# Patient Record
Sex: Male | Born: 1960 | Race: White | Hispanic: No | Marital: Single | Smoking: Current every day smoker
Health system: Southern US, Community
[De-identification: ages and names within clinical notes are randomized; demographics above are authoritative.]

## PROBLEM LIST (undated history)

## (undated) DIAGNOSIS — I639 Cerebral infarction, unspecified: Secondary | ICD-10-CM

## (undated) DIAGNOSIS — I2510 Atherosclerotic heart disease of native coronary artery without angina pectoris: Secondary | ICD-10-CM

## (undated) HISTORY — PX: CARDIAC SURGERY: SHX584

---

## 2005-08-31 ENCOUNTER — Emergency Department: Payer: Self-pay | Admitting: Unknown Physician Specialty

## 2008-10-07 ENCOUNTER — Inpatient Hospital Stay: Payer: Self-pay | Admitting: Specialist

## 2009-11-30 ENCOUNTER — Emergency Department: Payer: Self-pay | Admitting: Emergency Medicine

## 2011-02-22 ENCOUNTER — Emergency Department: Payer: Self-pay | Admitting: Emergency Medicine

## 2016-12-22 ENCOUNTER — Emergency Department
Admission: EM | Admit: 2016-12-22 | Discharge: 2016-12-22 | Disposition: A | Discharge status: Home / Self Care | Payer: Self-pay | Attending: Student in an Organized Health Care Education/Training Program | Admitting: Student in an Organized Health Care Education/Training Program

## 2016-12-22 ENCOUNTER — Emergency Department: Payer: Self-pay

## 2016-12-22 ENCOUNTER — Encounter: Payer: Self-pay | Admitting: Emergency Medicine

## 2016-12-22 DIAGNOSIS — Y9389 Activity, other specified: Secondary | ICD-10-CM | POA: Insufficient documentation

## 2016-12-22 DIAGNOSIS — F17200 Nicotine dependence, unspecified, uncomplicated: Secondary | ICD-10-CM | POA: Insufficient documentation

## 2016-12-22 DIAGNOSIS — I2510 Atherosclerotic heart disease of native coronary artery without angina pectoris: Secondary | ICD-10-CM | POA: Insufficient documentation

## 2016-12-22 DIAGNOSIS — S39012A Strain of muscle, fascia and tendon of lower back, initial encounter: Secondary | ICD-10-CM | POA: Insufficient documentation

## 2016-12-22 DIAGNOSIS — W010XXA Fall on same level from slipping, tripping and stumbling without subsequent striking against object, initial encounter: Secondary | ICD-10-CM | POA: Insufficient documentation

## 2016-12-22 DIAGNOSIS — Y999 Unspecified external cause status: Secondary | ICD-10-CM | POA: Insufficient documentation

## 2016-12-22 DIAGNOSIS — Y929 Unspecified place or not applicable: Secondary | ICD-10-CM | POA: Insufficient documentation

## 2016-12-22 HISTORY — DX: Cerebral infarction, unspecified: I63.9

## 2016-12-22 HISTORY — DX: Atherosclerotic heart disease of native coronary artery without angina pectoris: I25.10

## 2016-12-22 MED ORDER — METHOCARBAMOL 500 MG PO TABS: 500.0000 mg | ORAL_TABLET | Freq: Four times a day (QID) | ORAL | 0 refills | Status: DC

## 2016-12-22 MED ORDER — KETOROLAC TROMETHAMINE 30 MG/ML IJ SOLN
30.0000 mg | Freq: Once | INTRAMUSCULAR | Status: AC
  Administered 2016-12-22: 30 mg via INTRAMUSCULAR
  Filled 2016-12-22: qty 1

## 2016-12-22 MED ORDER — MELOXICAM 15 MG PO TABS: 15.0000 mg | ORAL_TABLET | Freq: Every day | ORAL | 0 refills | Status: DC

## 2016-12-22 MED ORDER — ORPHENADRINE CITRATE 30 MG/ML IJ SOLN
60.0000 mg | Freq: Once | INTRAMUSCULAR | Status: AC
  Administered 2016-12-22: 60 mg via INTRAMUSCULAR
  Filled 2016-12-22: qty 2

## 2016-12-22 NOTE — ED Provider Notes (Signed)
[facility] Emergency Department Provider Note  ____________________________________________  Time seen: Approximately 3:03 PM  I have reviewed the triage vital signs and the nursing notes.   HISTORY  Chief Complaint Back Pain    HPI Kevin Hopkins is a 56 y.o. male who presents to emergency department complaining of right-sided lower back pain. Patient states that approximately a week ago he had been drinking heavily when he slipped and fell on ice landing directly on his lower back. Patient states that he's had sharp, continual pain to the right lower back. He denies any radiation. No bowel or bladder dysfunction, saddle anesthesia, paresthesias. No history of back issues.  Patient states that due to his excessive drinking, [facility] a [facility] and is seeking help there for his drinking issues. Patient reports at this time he is not able to be prescribed any controlled substances.   Past Medical History:  Diagnosis Date  . Coronary artery disease   . CVA (cerebral vascular accident) (HCC)     There are no active problems to display for this patient.   Past Surgical History:  Procedure Laterality Date  . CARDIAC SURGERY      Prior to Admission medications   Medication Sig Start Date End Date Taking? Authorizing Provider  meloxicam (MOBIC) 15 MG tablet Take 1 tablet (15 mg total) by mouth daily. 12/22/16   Delorise Royals Stesha Neyens, PA-C  methocarbamol (ROBAXIN) 500 MG tablet Take 1 tablet (500 mg total) by mouth 4 (four) times daily. 12/22/16   Delorise Royals Thorne Wirz, PA-C    Allergies Patient has no allergy information on record.  History reviewed. No pertinent family history.  Social History Social History  Substance Use Topics  . Smoking status: Current Every Day Smoker    Packs/day: 1.00  . Smokeless tobacco: Never Used  . Alcohol use No     Review of Systems  Constitutional: No fever/chills Eyes: No visual  changes. Cardiovascular: no chest pain. Respiratory: no cough. No SOB. Gastrointestinal: No abdominal pain.  No nausea, no vomiting.   Genitourinary: Negative for dysuria. No hematuria Musculoskeletal: Positive for right lower back pain Skin: Negative for rash, abrasions, lacerations, ecchymosis. Neurological: Negative for headaches, focal weakness or numbness. 10-point ROS otherwise negative.  ____________________________________________   PHYSICAL EXAM:  VITAL SIGNS: ED Triage Vitals  Enc Vitals Group     BP 12/22/16 1423 129/81     Pulse Rate 12/22/16 1423 81     Resp 12/22/16 1423 16     Temp 12/22/16 1423 97.9 F (36.6 C)     Temp Source 12/22/16 1423 Oral     SpO2 12/22/16 1423 98 %     Weight 12/22/16 1424 160 lb (72.6 kg)     Height 12/22/16 1424 5\' 6"  (1.676 m)     Head Circumference --      Peak Flow --      Pain Score 12/22/16 1432 8     Pain Loc --      Pain Edu? --      Excl. in GC? --      Constitutional: Alert and oriented. Well appearing and in no acute distress. Eyes: Conjunctivae are normal. PERRL. EOMI. Head: Atraumatic. Neck: No stridor.    Cardiovascular: Normal rate, regular rhythm. Normal S1 and S2.  Good peripheral circulation. Respiratory: Normal respiratory effort without tachypnea or retractions. Lungs CTAB. Good air entry to the bases with no decreased or absent breath sounds. Gastrointestinal: Bowel sounds 4 quadrants. Soft  and nontender to palpation. No guarding or rigidity. No palpable masses. No distention. No CVA tenderness. Musculoskeletal: Full range of motion to all extremities. No gross deformities appreciated. His oropharynx is on inspection. Full range of motion spine. No ecchymosis. Patient is very tender to palpation right side of the spine/paraspinal muscle group. Tender to palpation over the L3-L5 region. No palpable abnormality. No tenderness to palpation over sciatic notches bilaterally. Dorsalis pedis pulse intact bilateral  lower extremity. Sensation intact and equal in all dermatomal distributions in the lower extremity.    Neurologic:  Normal speech and language. No gross focal neurologic deficits are appreciated.  Skin:  Skin is warm, dry and intact. No rash noted. Psychiatric: Mood and affect are normal. Speech and behavior are normal. Patient exhibits appropriate insight and judgement.   ____________________________________________   LABS (all labs ordered are listed, but only abnormal results are displayed)  Labs Reviewed - No data to display ____________________________________________  EKG   ____________________________________________  RADIOLOGY Festus Barren Elveria Lauderbaugh, personally viewed and evaluated these images (plain radiographs) as part of my medical decision making, as well as reviewing the written report by the radiologist.  Dg Lumbar Spine Complete  Result Date: 12/22/2016 CLINICAL DATA:  Lower right back pain for 1 week. Fall 7-8 days ago. EXAM: LUMBAR SPINE - COMPLETE 4+ VIEW COMPARISON:  None. FINDINGS: Mild degenerative disc disease changes throughout the lumbar spine. Degenerative facet disease in the lower lumbar spine. Slight retrolisthesis of L5 on S1. No fracture. SI joints are symmetric and unremarkable. IMPRESSION: Degenerative changes.  No acute findings. Electronically Signed   By: Charlett Nose M.D.   On: 12/22/2016 15:53    ____________________________________________    PROCEDURES  Procedure(s) performed:    Procedures    Medications  ketorolac (TORADOL) 30 MG/ML injection 30 mg (30 mg Intramuscular Given 12/22/16 1610)  orphenadrine (NORFLEX) injection 60 mg (60 mg Intramuscular Given 12/22/16 1610)     ____________________________________________   INITIAL IMPRESSION / ASSESSMENT AND PLAN / ED COURSE  Pertinent labs & imaging results that were available during my care of the patient were reviewed by me and considered in my medical decision making (see  chart for details).  Review of the  [place] was performed in accordance of the [place] prior to dispensing any controlled drugs.     Patient's diagnosis is consistent with lumbosacral strain. Patient reports falling a week ago. X-ray reveals no acute osseous abnormality. Exam was reassuring with no neuro deficits. Patient was given injection of Toradol and Norflex in the emergency department. Patient will be discharged home with prescriptions for anti-inflammatories and muscle relaxer. Patient is to follow up with orthopedics as needed or otherwise directed. Patient is given ED precautions to return to the ED for any worsening or new symptoms.     ____________________________________________  FINAL CLINICAL IMPRESSION(S) / ED DIAGNOSES  Final diagnoses:  Strain of lumbar region, initial encounter      NEW MEDICATIONS STARTED DURING THIS VISIT:  New Prescriptions   MELOXICAM (MOBIC) 15 MG TABLET    Take 1 tablet (15 mg total) by mouth daily.   METHOCARBAMOL (ROBAXIN) 500 MG TABLET    Take 1 tablet (500 mg total) by mouth 4 (four) times daily.        This chart was dictated using voice recognition software/Dragon. Despite best efforts to proofread, errors can occur which can change the meaning. Any change was purely unintentional.    Racheal Patches, PA-C 12/22/16 1617    Luisa Hart  Roxan Hockeyobinson, MD 12/22/16 [phone]

## 2016-12-22 NOTE — ED Notes (Signed)
Pt given crackers and drink. Called for ride home.

## 2016-12-22 NOTE — ED Triage Notes (Signed)
Pt presents with lower back pain on right side after falling a week ago. He reports that he is staying at a mission and is not allowed to have any narcotics. Pt alert & oriented with NAD noted.

## 2017-01-16 ENCOUNTER — Ambulatory Visit: Payer: Self-pay | Admitting: Adult Health Nurse Practitioner

## 2017-01-16 VITALS — BP 131/85 | HR 92 | Temp 99.2°F | Wt 176.0 lb

## 2017-01-16 DIAGNOSIS — M25561 Pain in right knee: Principal | ICD-10-CM

## 2017-01-16 DIAGNOSIS — M25562 Pain in left knee: Principal | ICD-10-CM

## 2017-01-16 DIAGNOSIS — G8929 Other chronic pain: Secondary | ICD-10-CM

## 2017-01-16 DIAGNOSIS — M79674 Pain in right toe(s): Secondary | ICD-10-CM

## 2017-01-16 DIAGNOSIS — Z0000 Encounter for general adult medical examination without abnormal findings: Secondary | ICD-10-CM

## 2017-01-16 MED ORDER — GABAPENTIN 600 MG PO TABS: 600.0000 mg | ORAL_TABLET | Freq: Two times a day (BID) | ORAL | 3 refills | Status: DC

## 2017-01-16 NOTE — Addendum Note (Signed)
Addended by: Charlene BrookeLES-JOHNSON, Jamaiya Tunnell N on: 01/16/2017 07:42 PM   Modules accepted: Orders

## 2017-01-16 NOTE — Progress Notes (Signed)
  Patient: Kevin SitesRoger Rzasa Male    DOB: 1961/05/30   56 y.o.   MRN: [id number] Visit Date: 01/16/2017  Today's Provider: Jacelyn Pieah Doles-Johnson, NP   Chief Complaint  Patient presents with  . Foot Pain    big toe, about a year   . Knee Pain    recieves cortisone injections   Subjective:    HPI  Pt states that his big toe has been hurting for 2 years.  Denies injury.  States that the toenail looks weird but no redness or edema.   Taking gabapentin for bilateral knee and leg pain with moderate relief.   He states that sometimes he takes 1-2 tablets during the day for pain.  Pt states that he used to get knee injections.     Not on File Previous Medications   GABAPENTIN (NEURONTIN) 600 MG TABLET    Take 600 mg by mouth 2 (two) times daily. 1 pill in morning 1 pill at night   MELOXICAM (MOBIC) 15 MG TABLET    Take 1 tablet (15 mg total) by mouth daily.   METHOCARBAMOL (ROBAXIN) 500 MG TABLET    Take 1 tablet (500 mg total) by mouth 4 (four) times daily.    Review of Systems  All other systems reviewed and are negative.   Social History  Substance Use Topics  . Smoking status: Current Every Day Smoker    Packs/day: 1.00  . Smokeless tobacco: Never Used  . Alcohol use No   Objective:   BP 131/85   Pulse 92   Temp 99.2 F (37.3 C) (Oral)   Wt 176 lb (79.8 kg)   BMI 28.41 kg/m   Physical Exam  Constitutional: He is oriented to person, place, and time. He appears well-developed and well-nourished.  HENT:  Head: Normocephalic and atraumatic.  Eyes: Pupils are equal, round, and reactive to light.  Neck: Normal range of motion. Neck supple.  Cardiovascular: Normal rate, regular rhythm, normal heart sounds and intact distal pulses.   Pulmonary/Chest: Effort normal and breath sounds normal.  Abdominal: Soft. Bowel sounds are normal.  Musculoskeletal:       Feet:  Neurological: He is alert and oriented to person, place, and time.  Skin: Skin is warm and dry.  Psychiatric: He  has a normal mood and affect. His behavior is normal.  Vitals reviewed.       Assessment & Plan:         R great toe pain:  Will check uric acid.  Soak foot in Epsom salts- ? Ingrown toenail   Bilateral knee pain:  Refill gabapentin.  Referral ortho clinic.   Baseline labs.   Jacelyn Pieah Doles-Johnson, NP   [facility]

## 2017-01-17 ENCOUNTER — Other Ambulatory Visit: Payer: Self-pay

## 2017-01-17 DIAGNOSIS — M25561 Pain in right knee: Principal | ICD-10-CM

## 2017-01-17 DIAGNOSIS — G8929 Other chronic pain: Secondary | ICD-10-CM

## 2017-01-17 DIAGNOSIS — M25562 Pain in left knee: Principal | ICD-10-CM

## 2017-01-17 LAB — URINALYSIS
Bilirubin, UA: NEGATIVE
Glucose, UA: NEGATIVE
Ketones, UA: NEGATIVE
LEUKOCYTES UA: NEGATIVE
NITRITE UA: NEGATIVE
PH UA: 5.5 (ref 5.0–7.5)
Protein, UA: NEGATIVE
RBC, UA: NEGATIVE
Specific Gravity, UA: 1.021 (ref 1.005–1.030)
UUROB: 0.2 mg/dL (ref 0.2–1.0)

## 2017-01-17 LAB — CBC
HEMATOCRIT: 39.9 % (ref 37.5–51.0)
Hemoglobin: 14 g/dL (ref 13.0–17.7)
MCH: 32.2 pg (ref 26.6–33.0)
MCHC: 35.1 g/dL (ref 31.5–35.7)
MCV: 92 fL (ref 79–97)
Platelets: 293 10*3/uL (ref 150–379)
RBC: 4.35 x10E6/uL (ref 4.14–5.80)
RDW: 12.7 % (ref 12.3–15.4)
WBC: 9.1 10*3/uL (ref 3.4–10.8)

## 2017-01-17 LAB — COMPREHENSIVE METABOLIC PANEL
A/G RATIO: 1.7 (ref 1.2–2.2)
ALK PHOS: 58 IU/L (ref 39–117)
ALT: 15 IU/L (ref 0–44)
AST: 17 IU/L (ref 0–40)
Albumin: 4.3 g/dL (ref 3.5–5.5)
BILIRUBIN TOTAL: 0.3 mg/dL (ref 0.0–1.2)
BUN / CREAT RATIO: 13 (ref 9–20)
BUN: 17 mg/dL (ref 6–24)
CHLORIDE: 103 mmol/L (ref 96–106)
CO2: 23 mmol/L (ref 18–29)
Calcium: 9.5 mg/dL (ref 8.7–10.2)
Creatinine, Ser: 1.3 mg/dL — ABNORMAL HIGH (ref 0.76–1.27)
GFR calc Af Amer: 72 (ref >59)
GFR calc non Af Amer: 62 (ref >59)
GLOBULIN, TOTAL: 2.5 (ref 1.5–4.5)
Glucose: 88 mg/dL (ref 65–99)
POTASSIUM: 4.8 mmol/L (ref 3.5–5.2)
SODIUM: 143 mmol/L (ref 134–144)
Total Protein: 6.8 g/dL (ref 6.0–8.5)

## 2017-01-17 LAB — HEMOGLOBIN A1C
Est. average glucose Bld gHb Est-mCnc: 103
HEMOGLOBIN A1C: 5.2 % (ref 4.8–5.6)

## 2017-01-17 LAB — LIPID PANEL W/O CHOL/HDL RATIO
Cholesterol, Total: 142 mg/dL (ref 100–199)
HDL: 53 mg/dL (ref >39)
LDL Calculated: 72 (ref 0–99)
TRIGLYCERIDES: 83 mg/dL (ref 0–149)
VLDL CHOLESTEROL CAL: 17 (ref 5–40)

## 2017-01-17 LAB — PSA: Prostate Specific Ag, Serum: 0.7 ng/mL (ref 0.0–4.0)

## 2017-01-17 LAB — TSH: TSH: 1.52 u[IU]/mL (ref 0.450–4.500)

## 2017-01-17 LAB — URIC ACID: Uric Acid: 5.6 mg/dL (ref 3.7–8.6)

## 2017-01-17 MED ORDER — GABAPENTIN 600 MG PO TABS: 600.0000 mg | ORAL_TABLET | Freq: Two times a day (BID) | ORAL | 3 refills | Status: DC

## 2017-01-23 ENCOUNTER — Other Ambulatory Visit: Payer: Self-pay

## 2017-01-23 DIAGNOSIS — G8929 Other chronic pain: Secondary | ICD-10-CM

## 2017-01-23 DIAGNOSIS — M25562 Pain in left knee: Principal | ICD-10-CM

## 2017-01-23 DIAGNOSIS — M25561 Pain in right knee: Principal | ICD-10-CM

## 2017-01-23 MED ORDER — MELOXICAM 15 MG PO TABS: 15.0000 mg | ORAL_TABLET | Freq: Every day | ORAL | 1 refills | Status: DC

## 2017-01-23 MED ORDER — METHOCARBAMOL 500 MG PO TABS: 500.0000 mg | ORAL_TABLET | Freq: Four times a day (QID) | ORAL | 1 refills | Status: DC

## 2017-01-29 ENCOUNTER — Ambulatory Visit: Payer: Self-pay | Admitting: Specialist

## 2017-02-06 ENCOUNTER — Ambulatory Visit: Payer: Self-pay | Admitting: Ophthalmology

## 2017-02-27 ENCOUNTER — Telehealth: Payer: Self-pay

## 2017-02-27 ENCOUNTER — Telehealth: Payer: Self-pay | Admitting: Pharmacy Technician

## 2017-02-27 NOTE — Telephone Encounter (Signed)
Spoke with [name].  Patient no longer a resident at their facility.  [facility] unable to provide additional medication assistance until patient provides updated financial information.  Sherilyn Dacosta Care Manager [facility]

## 2017-02-27 NOTE — Telephone Encounter (Signed)
Spoke with [name]. Patient no longer residing at their facility.

## 2017-06-04 ENCOUNTER — Telehealth: Payer: Self-pay | Admitting: Pharmacy Technician

## 2017-06-04 NOTE — Telephone Encounter (Signed)
Patient eligible to receive medication assistance at [facility] through 2018, as long as eligibility requirements continue to be met.  Xoie Kreuser J. Lula Kolton Care Manager [facility] 

## 2017-09-24 ENCOUNTER — Emergency Department: Payer: Self-pay

## 2017-09-24 ENCOUNTER — Emergency Department
Admission: EM | Admit: 2017-09-24 | Discharge: 2017-09-24 | Disposition: A | Discharge status: Home / Self Care | Payer: Self-pay | Attending: Emergency Medicine | Admitting: Emergency Medicine

## 2017-09-24 DIAGNOSIS — K4090 Unilateral inguinal hernia, without obstruction or gangrene, not specified as recurrent: Secondary | ICD-10-CM | POA: Insufficient documentation

## 2017-09-24 DIAGNOSIS — I2510 Atherosclerotic heart disease of native coronary artery without angina pectoris: Secondary | ICD-10-CM | POA: Insufficient documentation

## 2017-09-24 DIAGNOSIS — R3915 Urgency of urination: Secondary | ICD-10-CM | POA: Insufficient documentation

## 2017-09-24 DIAGNOSIS — F17200 Nicotine dependence, unspecified, uncomplicated: Secondary | ICD-10-CM | POA: Insufficient documentation

## 2017-09-24 DIAGNOSIS — Z79899 Other long term (current) drug therapy: Secondary | ICD-10-CM | POA: Insufficient documentation

## 2017-09-24 DIAGNOSIS — Z8673 Personal history of transient ischemic attack (TIA), and cerebral infarction without residual deficits: Secondary | ICD-10-CM | POA: Insufficient documentation

## 2017-09-24 LAB — URINALYSIS, COMPLETE (UACMP) WITH MICROSCOPIC
BILIRUBIN URINE: NEGATIVE
Bacteria, UA: NONE SEEN
Glucose, UA: NEGATIVE mg/dL
Hgb urine dipstick: NEGATIVE
KETONES UR: 20 mg/dL — AB
Leukocytes, UA: NEGATIVE
Nitrite: NEGATIVE
Protein, ur: 30 mg/dL — AB
SPECIFIC GRAVITY, URINE: 1.021 (ref 1.005–1.030)
pH: 5 (ref 5.0–8.0)

## 2017-09-24 LAB — CBC
HCT: 48.7 % (ref 40.0–52.0)
HEMOGLOBIN: 16.4 g/dL (ref 13.0–18.0)
MCH: 33.6 pg (ref 26.0–34.0)
MCHC: 33.6 g/dL (ref 32.0–36.0)
MCV: 99.9 fL (ref 80.0–100.0)
Platelets: 302 10*3/uL (ref 150–440)
RBC: 4.88 MIL/uL (ref 4.40–5.90)
RDW: 13.8 % (ref 11.5–14.5)
WBC: 13.3 10*3/uL — AB (ref 3.8–10.6)

## 2017-09-24 LAB — COMPREHENSIVE METABOLIC PANEL
ALBUMIN: 5.1 g/dL — AB (ref 3.5–5.0)
ALT: 18 U/L (ref 17–63)
AST: 31 U/L (ref 15–41)
Alkaline Phosphatase: 51 U/L (ref 38–126)
Anion gap: 10 (ref 5–15)
BILIRUBIN TOTAL: 0.9 mg/dL (ref 0.3–1.2)
BUN: 9 mg/dL (ref 6–20)
CO2: 22 mmol/L (ref 22–32)
CREATININE: 0.84 mg/dL (ref 0.61–1.24)
Calcium: 9.4 mg/dL (ref 8.9–10.3)
Chloride: 106 mmol/L (ref 101–111)
GFR calc Af Amer: >60 mL/min (ref >60)
GLUCOSE: 61 mg/dL — AB (ref 65–99)
POTASSIUM: 4 mmol/L (ref 3.5–5.1)
Sodium: 138 mmol/L (ref 135–145)
Total Protein: 8.5 g/dL — ABNORMAL HIGH (ref 6.5–8.1)

## 2017-09-24 LAB — GLUCOSE, CAPILLARY
Glucose-Capillary: 62 mg/dL — ABNORMAL LOW (ref 65–99)
Glucose-Capillary: 74 mg/dL (ref 65–99)

## 2017-09-24 LAB — CHLAMYDIA/NGC RT PCR (ARMC ONLY)
CHLAMYDIA TR: NOT DETECTED
N GONORRHOEAE: NOT DETECTED

## 2017-09-24 MED ORDER — IOPAMIDOL (ISOVUE-300) INJECTION 61%
100.0000 mL | Freq: Once | INTRAVENOUS | Status: AC | PRN
  Administered 2017-09-24: 100 mL via INTRAVENOUS

## 2017-09-24 MED ORDER — TAMSULOSIN HCL 0.4 MG PO CAPS
0.4000 mg | ORAL_CAPSULE | Freq: Once | ORAL | Status: AC
  Administered 2017-09-24: 0.4 mg via ORAL
  Filled 2017-09-24: qty 1

## 2017-09-24 MED ORDER — TAMSULOSIN HCL 0.4 MG PO CAPS: 0.4000 mg | ORAL_CAPSULE | Freq: Every day | ORAL | 0 refills | Status: DC

## 2017-09-24 NOTE — ED Notes (Signed)
Pt drank oj, has not eaten any food yet

## 2017-09-24 NOTE — ED Provider Notes (Addendum)
[facility] Emergency Department Provider Note  ____________________________________________  Time seen: Approximately 7:45 AM  I have reviewed the triage vital signs and the nursing notes.   HISTORY  Chief Complaint Abdominal Pain   HPI Kevin Hopkins is a 56 y.o. male with h/o CVA and CAD who presents for evaluation of bilateral groin pain. Patient reports few days of constant sharp non radiating bilateral groin pain worse with standing. Also reports urgency and has to strain to be able to void. NO dysuria, no abdominal pain, no fever or chills, no nausea or vomiting, no constipation or diarrhea. Patient is homeless. Reports that he drinks alcohol every day. Hasn't eaten in several days. No history of STD. No unintentional weight loss. No night sweats.  Past Medical History:  Diagnosis Date  . Coronary artery disease   . CVA (cerebral vascular accident) [facility])     Patient Active Problem List   Diagnosis Date Noted  . Great toe pain, right 01/16/2017  . Chronic pain of both knees 01/16/2017    Past Surgical History:  Procedure Laterality Date  . CARDIAC SURGERY      Prior to Admission medications   Medication Sig Start Date End Date Taking? Authorizing Provider  gabapentin (NEURONTIN) 600 MG tablet Take 1 tablet (600 mg total) by mouth 2 (two) times daily. 2  pills in morning 1 pill at night 01/17/17   Virl Axe, MD  meloxicam (MOBIC) 15 MG tablet Take 1 tablet (15 mg total) by mouth daily. 01/23/17   Michiel Cowboy A, PA-C  methocarbamol (ROBAXIN) 500 MG tablet Take 1 tablet (500 mg total) by mouth 4 (four) times daily. 01/23/17   Michiel Cowboy A, PA-C  tamsulosin (FLOMAX) 0.4 MG CAPS capsule Take 1 capsule (0.4 mg total) by mouth daily. 09/24/17   Nita Sickle, MD    Allergies Patient has no known allergies.  No family history on file.  Social History Social History  Substance Use Topics  . Smoking status: Current Every Day  Smoker    Packs/day: 1.00  . Smokeless tobacco: Never Used  . Alcohol use No    Review of Systems  Constitutional: Negative for fever. Eyes: Negative for visual changes. ENT: Negative for sore throat. Neck: No neck pain  Cardiovascular: Negative for chest pain. Respiratory: Negative for shortness of breath. Gastrointestinal: Negative for abdominal pain, vomiting or diarrhea. Genitourinary: Negative for dysuria. + urgency and bilateral groin pain Musculoskeletal: Negative for back pain. Skin: Negative for rash. Neurological: Negative for headaches, weakness or numbness. Psych: No SI or HI  ____________________________________________   PHYSICAL EXAM:  VITAL SIGNS: ED Triage Vitals  Enc Vitals Group     BP 09/24/17 0449 (!) 156/96     Pulse Rate 09/24/17 0449 98     Resp 09/24/17 0449 18     Temp 09/24/17 0449 97.9 F (36.6 C)     Temp Source 09/24/17 0449 Oral     SpO2 09/24/17 0449 99 %     Weight 09/24/17 0449 175 lb (79.4 kg)     Height 09/24/17 0449 5\' 6"  (1.676 m)     Head Circumference --      Peak Flow --      Pain Score 09/24/17 0448 0     Pain Loc --      Pain Edu? --      Excl. in GC? --     Constitutional: Alert and oriented, disheveled, no distress.  HEENT:  Head: Normocephalic and atraumatic.         Eyes: Conjunctivae are normal. Sclera is non-icteric.       Mouth/Throat: Mucous membranes are moist.       Neck: Supple with no signs of meningismus. Cardiovascular: Regular rate and rhythm. No murmurs, gallops, or rubs. 2+ symmetrical distal pulses are present in all extremities. No JVD. Respiratory: Normal respiratory effort. Lungs are clear to auscultation bilaterally. No wheezes, crackles, or rhonchi.  Gastrointestinal: Soft, non tender, and non distended with positive bowel sounds. No rebound or guarding. Genitourinary: No CVA tenderness. Bilateral testicles are descended with no tenderness to palpation, bilateral positive cremasteric reflexes  are present, no swelling or erythema of the scrotum. No evidence of inguinal hernia both laying and standing. Musculoskeletal: Nontender with normal range of motion in all extremities. No edema, cyanosis, or erythema of extremities. Neurologic: Normal speech and language. Face is symmetric. Moving all extremities. No gross focal neurologic deficits are appreciated. Skin: Skin is warm, dry and intact. No rash noted. Psychiatric: Mood and affect are normal. Speech and behavior are normal.  ____________________________________________   LABS (all labs ordered are listed, but only abnormal results are displayed)  Labs Reviewed  CBC - Abnormal; Notable for the following:       Result Value   WBC 13.3 (*)    All other components within normal limits  COMPREHENSIVE METABOLIC PANEL - Abnormal; Notable for the following:    Glucose, Bld 61 (*)    Total Protein 8.5 (*)    Albumin 5.1 (*)    All other components within normal limits  URINALYSIS, COMPLETE (UACMP) WITH MICROSCOPIC - Abnormal; Notable for the following:    Color, Urine YELLOW (*)    APPearance HAZY (*)    Ketones, ur 20 (*)    Protein, ur 30 (*)    Squamous Epithelial / LPF 0-5 (*)    All other components within normal limits  GLUCOSE, CAPILLARY - Abnormal; Notable for the following:    Glucose-Capillary 62 (*)    All other components within normal limits  CHLAMYDIA/NGC RT PCR ([facility] ONLY)  GLUCOSE, CAPILLARY  CBG MONITORING, ED   ____________________________________________  EKG  none ____________________________________________  RADIOLOGY  CT a/p: 1. No renal or ureteral calculi. No hydronephrosis. There are prostatic calculi. Urinary bladder wall does not appear appreciably thickened.  2. Left-sided colonic diverticula without diverticulitis. No bowel obstruction. No abscess. Appendix appears normal.  3. Fat containing left inguinal hernia. Minimal ventral hernia containing only fat. Small hiatal  hernia.  4. Aortoiliac atherosclerosis as well as foci of coronary artery calcification.  5. Hepatic steatosis.  6. Old rib fractures on the right. Mild anterior wedging of T11 vertebral body which appears old. No blastic or lytic bone lesion evident. ____________________________________________   PROCEDURES  Procedure(s) performed: None Procedures Critical Care performed:  None ____________________________________________   INITIAL IMPRESSION / ASSESSMENT AND PLAN / ED COURSE   56 y.o. male with h/o CVA and CAD who presents for evaluation of bilateral groin pain and urinary urgency for a few days. Patient disposition: But in no distress, his vitals are within normal limits, abdomen is soft with no tenderness throughout, GU exam is within normal limits with no evidence of torsion, no tenderness, no swelling, no erythema, no penile discharge, no evidence of hernia. Will get bladder scan to rule out urinary retention. Will check UA to rule out UTI. Will check GC and chlamydia.     _________________________ 2:29 PM on 09/24/2017 -----------------------------------------  labs show a leukocytosis with white count of 13 which prompted a CT abdomen and pelvis.CT showing L sided small inguinal hernia containing fat only which is not palpable on exam. No other acute findings. Post void scan of the bladder showing 130 cc probably from BPH. Patient has been urinating in the emergency department with no evidence of retention. We'll start him on Flomax. STD negative. There is no evidence of UTI or acute kidney injury. UA with no evidence of urinary tract infection. Patient remains well appearing. We'll discharge home at this time.   As part of my medical decision making, I reviewed the following data within the electronic MEDICAL RECORD NUMBER Nursing notes reviewed and incorporated, Labs reviewed , Radiograph reviewed , Notes from prior ED visits and [place] Controlled Substance Database    Pertinent  labs & imaging results that were available during my care of the patient were reviewed by me and considered in my medical decision making (see chart for details).    ____________________________________________   FINAL CLINICAL IMPRESSION(S) / ED DIAGNOSES  Final diagnoses:  Urinary urgency  Left inguinal hernia      NEW MEDICATIONS STARTED DURING THIS VISIT:  New Prescriptions   TAMSULOSIN (FLOMAX) 0.4 MG CAPS CAPSULE    Take 1 capsule (0.4 mg total) by mouth daily.     Note:  This document was prepared using Dragon voice recognition software and may include unintentional dictation errors.    Nita Sickle, MD 09/24/17 1431    Nita Sickle, MD 09/24/17 [phone]

## 2017-09-24 NOTE — ED Notes (Signed)
cbg 62

## 2017-09-24 NOTE — ED Notes (Signed)
Pt presents with groin pain x 2 days with urinary urgency and "feels like I can't go." Pt denies n/v/d; CBG is low. Pt states he has not eaten "in a couple of days." Pt alert & oriented, slow to answer questions. NAD noted.

## 2017-09-24 NOTE — ED Notes (Signed)
Pt discharged home after verbalizing understanding of discharge instructions; nad noted. 

## 2017-09-24 NOTE — ED Triage Notes (Signed)
Pt in with co groin pain for few days more when he walks. Denies any injury, but does have some urinary urgency.

## 2017-09-24 NOTE — ED Notes (Signed)
Bladder [place]\an shows 131 ml in bladder

## 2017-09-24 NOTE — ED Notes (Signed)
Pt given OJ, graham crackers, banana

## 2017-09-24 NOTE — ED Notes (Signed)
Pt is homeless and states he is not hungry; agrees to eat crackers and drink juice to get his sugar up.

## 2017-12-16 ENCOUNTER — Ambulatory Visit: Payer: Self-pay | Admitting: Adult Health Nurse Practitioner

## 2017-12-16 VITALS — BP 144/101 | HR 106 | Temp 97.5°F | Wt 178.8 lb

## 2017-12-16 DIAGNOSIS — D72829 Elevated white blood cell count, unspecified: Secondary | ICD-10-CM

## 2017-12-16 DIAGNOSIS — R030 Elevated blood-pressure reading, without diagnosis of hypertension: Secondary | ICD-10-CM

## 2017-12-16 NOTE — Progress Notes (Signed)
  Patient: Kevin SitesRoger Vea Male    DOB: 04-Jul-1961   57 y.o.   MRN: [id number] Visit Date: 12/16/2017  Today's Provider: [facility]   Chief Complaint  Patient presents with  . Follow-up    pt wants a physical for disability   Subjective:    HPI   He wants a physical for disability.  Pt states that [facility].  Pt states that he had a 24oz beer today.  Pt had WBC of 13.3 on labs about 2 months ago.  Afebrile- states that he felt like "crap" 2 weeks ago and took tylenol and now he feels better.      No Known Allergies Previous Medications   GABAPENTIN (NEURONTIN) 600 MG TABLET    Take 1 tablet (600 mg total) by mouth 2 (two) times daily. 2  pills in morning 1 pill at night   MELOXICAM (MOBIC) 15 MG TABLET    Take 1 tablet (15 mg total) by mouth daily.   METHOCARBAMOL (ROBAXIN) 500 MG TABLET    Take 1 tablet (500 mg total) by mouth 4 (four) times daily.   TAMSULOSIN (FLOMAX) 0.4 MG CAPS CAPSULE    Take 1 capsule (0.4 mg total) by mouth daily.    Review of Systems  All other systems reviewed and are negative.   Social History   Tobacco Use  . Smoking status: Current Every Day Smoker    Packs/day: 1.00  . Smokeless tobacco: Never Used  Substance Use Topics  . Alcohol use: No   Objective:   BP (!) 144/101   Pulse (!) 106   Temp (!) 97.5 F (36.4 C) (Oral)   Wt 178 lb 12.8 oz (81.1 kg)   BMI 28.86 kg/m   Physical Exam  Constitutional: He appears well-developed and well-nourished.  HENT:  Head: Normocephalic and atraumatic.  Cardiovascular: Normal rate and regular rhythm.  Pulmonary/Chest: Effort normal and breath sounds normal.  Abdominal: Soft. Bowel sounds are normal.  Vitals reviewed.       Assessment & Plan:        Repeat CBC with diff.  FU in 4 weeks for BP check when sober.    [facility]   [facility]

## 2017-12-17 LAB — CBC WITH DIFFERENTIAL/PLATELET
BASOS ABS: 0 10*3/uL (ref 0.0–0.2)
Basos: 1 %
EOS (ABSOLUTE): 0.3 10*3/uL (ref 0.0–0.4)
Eos: 4 %
Hematocrit: 42.5 % (ref 37.5–51.0)
Hemoglobin: 15.2 g/dL (ref 13.0–17.7)
Immature Grans (Abs): 0 10*3/uL (ref 0.0–0.1)
Immature Granulocytes: 0 %
LYMPHS ABS: 3 10*3/uL (ref 0.7–3.1)
Lymphs: 38 %
MCH: 33 pg (ref 26.6–33.0)
MCHC: 35.8 g/dL — AB (ref 31.5–35.7)
MCV: 92 fL (ref 79–97)
MONOCYTES: 9 %
Monocytes Absolute: 0.8 10*3/uL (ref 0.1–0.9)
NEUTROS ABS: 3.9 10*3/uL (ref 1.4–7.0)
Neutrophils: 48 %
PLATELETS: 372 10*3/uL (ref 150–379)
RBC: 4.6 x10E6/uL (ref 4.14–5.80)
RDW: 13.4 % (ref 12.3–15.4)
WBC: 8 10*3/uL (ref 3.4–10.8)

## 2018-01-20 ENCOUNTER — Ambulatory Visit: Payer: Self-pay | Admitting: Family Medicine

## 2018-01-20 VITALS — BP 131/79 | HR 91 | Temp 98.3°F | Wt 177.9 lb

## 2018-01-20 DIAGNOSIS — M25561 Pain in right knee: Secondary | ICD-10-CM

## 2018-01-20 DIAGNOSIS — M25562 Pain in left knee: Secondary | ICD-10-CM

## 2018-01-20 DIAGNOSIS — Z09 Encounter for follow-up examination after completed treatment for conditions other than malignant neoplasm: Secondary | ICD-10-CM

## 2018-01-20 DIAGNOSIS — G8929 Other chronic pain: Secondary | ICD-10-CM

## 2018-01-20 DIAGNOSIS — M79674 Pain in right toe(s): Secondary | ICD-10-CM

## 2018-01-20 MED ORDER — GABAPENTIN 600 MG PO TABS: 600.0000 mg | ORAL_TABLET | Freq: Two times a day (BID) | ORAL | 3 refills | Status: AC

## 2018-01-20 NOTE — Progress Notes (Signed)
  Patient: Kevin Hopkins Male    DOB: 12-28-1960   57 y.o.   MRN: [id number] Visit Date: 01/20/2018  Today's Provider: Kallie LocksNatalie M Mellie Buccellato, FNP   Chief Complaint  Patient presents with  . Follow-up    gout pain in right great toe, stroke 3 years ago   Subjective:    HPI   Patient states that he is here for follow up and refills on medications. He is feeling better. He has not [facility], because he lost his phone and has not been reachable.  He denies fevers, chills, infections, and night sweats. Denies cough, shortness of breath, and chest pain. States that she does have mild lower back pain. 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 Denies falls and dizziness.   No Known Allergies Previous Medications   GABAPENTIN (NEURONTIN) 600 MG TABLET    Take 1 tablet (600 mg total) by mouth 2 (two) times daily. 2  pills in morning 1 pill at night    Review of Systems  Constitutional: Positive for fatigue.  HENT: Negative.   Eyes: Negative.   Respiratory: Negative.   Cardiovascular: Negative.   Gastrointestinal: Negative.   Endocrine: Negative.   Genitourinary: Negative.   Musculoskeletal: Positive for back pain and gait problem.       Residual weakness since stroke 3-4 years ago.   Skin: Negative.   Allergic/Immunologic: Negative.   Hematological: Negative.   Psychiatric/Behavioral: Negative.   All other systems reviewed and are negative.   Social History   Tobacco Use  . Smoking status: Current Every Day Smoker    Packs/day: 1.00  . Smokeless tobacco: Never Used  Substance Use Topics  . Alcohol use: No   Objective:   BP 131/79 (BP Location: Left Arm, Patient Position: Sitting)   Pulse 91   Temp 98.3 F (36.8 C)   Wt 177 lb 14.4 oz (80.7 kg)   BMI 28.71 kg/m   Physical Exam  Constitutional: He is oriented to person, place, and time. He appears well-developed and well-nourished.  HENT:  Head: Normocephalic and atraumatic.   Eyes: Conjunctivae are normal.  Neck: Normal range of motion. Neck supple.  Cardiovascular: Normal rate, regular rhythm, normal heart sounds and intact distal pulses.  Pulmonary/Chest: Effort normal and breath sounds normal.  Abdominal: Soft. Bowel sounds are normal.  Musculoskeletal:  Residual left sided weakness from Stroke 3-4 years ago.   Neurological: He is alert and oriented to person, place, and time. He has normal reflexes.  Skin: Skin is warm and dry.  Psychiatric: He has a normal mood and affect.  Vitals reviewed.  Assessment & Plan:    1. Great toe pain, right Stable. Gabapentin is effective in treating pain. Refill to pharmacy today.   2. Chronic pain of both knees Stable. Continue Gabapentin.   3. Follow up Follow up in 3 months OV. Follow up asap to have repeat CBC.   Kallie LocksNatalie M Liba Hulsey, FNP   [facility]

## 2018-01-22 ENCOUNTER — Other Ambulatory Visit: Payer: Self-pay

## 2018-01-30 ENCOUNTER — Telehealth: Payer: Self-pay | Admitting: Pharmacy Technician

## 2018-01-30 NOTE — Telephone Encounter (Signed)
Patient failed to provide 2019 financial documentation.  No additional medication assistance will be provided by [facility] without the required proof of income documentation.  Patient notified by letter.  Shan Padgett J. Oaklynn Stierwalt Care Manager [facility] 

## 2018-02-19 ENCOUNTER — Other Ambulatory Visit: Payer: Self-pay

## 2018-02-19 DIAGNOSIS — Z0000 Encounter for general adult medical examination without abnormal findings: Secondary | ICD-10-CM

## 2018-02-20 LAB — CBC
HEMATOCRIT: 42.3 % (ref 37.5–51.0)
HEMOGLOBIN: 15.2 g/dL (ref 13.0–17.7)
MCH: 33.2 pg — ABNORMAL HIGH (ref 26.6–33.0)
MCHC: 35.9 g/dL — ABNORMAL HIGH (ref 31.5–35.7)
MCV: 92 fL (ref 79–97)
Platelets: 334 10*3/uL (ref 150–379)
RBC: 4.58 x10E6/uL (ref 4.14–5.80)
RDW: 13.5 % (ref 12.3–15.4)
WBC: 9.3 10*3/uL (ref 3.4–10.8)

## 2018-02-24 ENCOUNTER — Emergency Department
Admission: EM | Admit: 2018-02-24 | Discharge: 2018-02-24 | Disposition: A | Discharge status: Home / Self Care | Payer: Medicaid Other | Attending: Emergency Medicine | Admitting: Emergency Medicine

## 2018-02-24 ENCOUNTER — Encounter: Payer: Self-pay | Admitting: Emergency Medicine

## 2018-02-24 ENCOUNTER — Emergency Department: Payer: Medicaid Other

## 2018-02-24 DIAGNOSIS — R531 Weakness: Secondary | ICD-10-CM | POA: Diagnosis present

## 2018-02-24 DIAGNOSIS — F17200 Nicotine dependence, unspecified, uncomplicated: Secondary | ICD-10-CM | POA: Insufficient documentation

## 2018-02-24 DIAGNOSIS — Z8673 Personal history of transient ischemic attack (TIA), and cerebral infarction without residual deficits: Secondary | ICD-10-CM | POA: Diagnosis not present

## 2018-02-24 DIAGNOSIS — F1020 Alcohol dependence, uncomplicated: Secondary | ICD-10-CM | POA: Insufficient documentation

## 2018-02-24 DIAGNOSIS — I2510 Atherosclerotic heart disease of native coronary artery without angina pectoris: Secondary | ICD-10-CM | POA: Insufficient documentation

## 2018-02-24 LAB — URINALYSIS, COMPLETE (UACMP) WITH MICROSCOPIC
Bacteria, UA: NONE SEEN
Bilirubin Urine: NEGATIVE
GLUCOSE, UA: NEGATIVE mg/dL
Hgb urine dipstick: NEGATIVE
Ketones, ur: NEGATIVE mg/dL
LEUKOCYTES UA: NEGATIVE
NITRITE: NEGATIVE
PH: 7 (ref 5.0–8.0)
Protein, ur: NEGATIVE mg/dL
RBC / HPF: NONE SEEN RBC/hpf (ref 0–5)
Specific Gravity, Urine: 1.01 (ref 1.005–1.030)
Squamous Epithelial / LPF: NONE SEEN

## 2018-02-24 LAB — BASIC METABOLIC PANEL
Anion gap: 7 (ref 5–15)
BUN: 12 mg/dL (ref 6–20)
CALCIUM: 9.3 mg/dL (ref 8.9–10.3)
CO2: 27 mmol/L (ref 22–32)
CREATININE: 0.74 mg/dL (ref 0.61–1.24)
Chloride: 109 mmol/L (ref 101–111)
GFR calc Af Amer: >60 mL/min (ref >60)
Glucose, Bld: 115 mg/dL — ABNORMAL HIGH (ref 65–99)
Potassium: 4.5 mmol/L (ref 3.5–5.1)
Sodium: 143 mmol/L (ref 135–145)

## 2018-02-24 LAB — URINE DRUG SCREEN, QUALITATIVE (ARMC ONLY)
Amphetamines, Ur Screen: NOT DETECTED
Barbiturates, Ur Screen: NOT DETECTED
Benzodiazepine, Ur Scrn: POSITIVE — AB
COCAINE METABOLITE, UR ~~LOC~~: NOT DETECTED
Cannabinoid 50 Ng, Ur ~~LOC~~: NOT DETECTED
MDMA (ECSTASY) UR SCREEN: NOT DETECTED
Methadone Scn, Ur: NOT DETECTED
Opiate, Ur Screen: NOT DETECTED
Phencyclidine (PCP) Ur S: NOT DETECTED
TRICYCLIC, UR SCREEN: NOT DETECTED

## 2018-02-24 LAB — CBC
HCT: 43.6 % (ref 40.0–52.0)
Hemoglobin: 14.6 g/dL (ref 13.0–18.0)
MCH: 32.2 pg (ref 26.0–34.0)
MCHC: 33.6 g/dL (ref 32.0–36.0)
MCV: 96 fL (ref 80.0–100.0)
PLATELETS: 357 10*3/uL (ref 150–440)
RBC: 4.54 MIL/uL (ref 4.40–5.90)
RDW: 14.2 % (ref 11.5–14.5)
WBC: 7.3 10*3/uL (ref 3.8–10.6)

## 2018-02-24 LAB — TROPONIN I: Troponin I: <0.03 ng/mL (ref <0.03)

## 2018-02-24 LAB — ETHANOL: Alcohol, Ethyl (B): <10 mg/dL (ref <10)

## 2018-02-24 MED ORDER — THIAMINE HCL 100 MG/ML IJ SOLN
Freq: Once | INTRAVENOUS | Status: AC
  Administered 2018-02-24: 13:00:00 via INTRAVENOUS
  Filled 2018-02-24: qty 1000

## 2018-02-24 MED ORDER — THIAMINE HCL 100 MG/ML IJ SOLN
100.0000 mg | Freq: Once | INTRAMUSCULAR | Status: AC
  Administered 2018-02-24: 100 mg via INTRAVENOUS
  Filled 2018-02-24: qty 2

## 2018-02-24 MED ORDER — FOLIC ACID 1 MG PO TABS
1.0000 mg | ORAL_TABLET | Freq: Once | ORAL | Status: AC
  Administered 2018-02-24: 1 mg via ORAL
  Filled 2018-02-24: qty 1

## 2018-02-24 MED ORDER — ASPIRIN 81 MG PO TBEC: 81.0000 mg | DELAYED_RELEASE_TABLET | Freq: Every day | ORAL | 12 refills | Status: AC

## 2018-02-24 MED ORDER — ASPIRIN 81 MG PO CHEW
324.0000 mg | CHEWABLE_TABLET | Freq: Once | ORAL | Status: AC
  Administered 2018-02-24: 324 mg via ORAL
  Filled 2018-02-24: qty 4

## 2018-02-24 MED ORDER — VITAMIN B-1 100 MG PO TABS: 100.0000 mg | ORAL_TABLET | Freq: Two times a day (BID) | ORAL | 2 refills | Status: AC

## 2018-02-24 MED ORDER — MULTI-VITAMIN/MINERALS PO TABS: 1.0000 | ORAL_TABLET | Freq: Every day | ORAL | 2 refills | Status: AC

## 2018-02-24 NOTE — ED Provider Notes (Signed)
[facility] Emergency Department Provider Note       Time seen: ----------------------------------------- 12:29 PM on 02/24/2018 -----------------------------------------   I have reviewed the triage vital signs and the nursing notes.  HISTORY   Chief Complaint Weakness    HPI Kevin Hopkins is a 57 y.o. male with a history of coronary artery disease, CVA and chronic alcohol abuse who presents to the ED for weakness.  Patient arrives via EMS from [facility] for alcohol detox.  Patient states that his left side has been weak and weaker than normal.  He reports a history of stroke affecting his left leg and he states his long as he was drinking he was able to maintain function.  Now that he has been drinking for the past 3 days patient states he has difficulty walking particularly in the left leg.  He denies any other focal neurologic complaint.  Past Medical History:  Diagnosis Date  . Coronary artery disease   . CVA (cerebral vascular accident) [facility](HCC)     Patient Active Problem List   Diagnosis Date Noted  . Great toe pain, right 01/16/2017  . Chronic pain of both knees 01/16/2017    Past Surgical History:  Procedure Laterality Date  . CARDIAC SURGERY      Allergies Patient has no known allergies.  Social History Social History   Tobacco Use  . Smoking status: Current Every Day Smoker    Packs/day: 1.00  . Smokeless tobacco: Never Used  Substance Use Topics  . Alcohol use: No  . Drug use: No   Review of Systems Constitutional: Negative for fever. Cardiovascular: Negative for chest pain. Respiratory: Negative for shortness of breath. Gastrointestinal: Negative for abdominal pain, vomiting and diarrhea. Genitourinary: Negative for dysuria. Musculoskeletal: Negative for back pain. Skin: Negative for rash. Neurological: Negative for headaches, positive for left leg weakness  All systems negative/normal/unremarkable except as stated in the  HPI  ____________________________________________   PHYSICAL EXAM:  VITAL SIGNS: ED Triage Vitals  Enc Vitals Group     BP 02/24/18 1207 (!) 141/83     Pulse Rate 02/24/18 1207 84     Resp 02/24/18 1207 18     Temp 02/24/18 1207 98.5 F (36.9 C)     Temp Source 02/24/18 1207 Oral     SpO2 02/24/18 1207 96 %     Weight 02/24/18 1211 175 lb (79.4 kg)     Height 02/24/18 1211 5\' 7"  (1.702 m)     Head Circumference --      Peak Flow --      Pain Score 02/24/18 1207 7     Pain Loc --      Pain Edu? --      Excl. in GC? --    Constitutional: Alert and oriented.  No distress Eyes: Conjunctivae are normal. Normal extraocular movements. ENT   Head: Normocephalic and atraumatic.   Nose: No congestion/rhinnorhea.   Mouth/Throat: Mucous membranes are moist.   Neck: No stridor. Cardiovascular: Normal rate, regular rhythm. No murmurs, rubs, or gallops. Respiratory: Normal respiratory effort without tachypnea nor retractions. Breath sounds are clear and equal bilaterally. No wheezes/rales/rhonchi. Gastrointestinal: Soft and nontender. Normal bowel sounds Musculoskeletal: Nontender with normal range of motion in extremities. No lower extremity tenderness nor edema. Neurologic:  Normal speech and language. No gross focal neurologic deficits are appreciated.  Left leg weakness compared to right, no sensory deficits, cranial nerves are normal Skin:  Skin is warm, dry and intact. No rash noted. Psychiatric:  Mood and affect are normal. Speech and behavior are normal.  ____________________________________________  EKG: Interpreted by me.  Sinus rhythm with a rate of 83 bpm, PVC, normal axis, normal QT  ____________________________________________  ED COURSE:  As part of my medical decision making, I reviewed the following data within the electronic MEDICAL RECORD NUMBER History obtained from family if available, nursing notes, old chart and ekg, as well as notes from prior ED  visits. Patient presented for weakness and difficulty walking with a previous history of stroke affecting the same side, we will assess with labs and imaging as indicated at this time.   Procedures ____________________________________________   LABS (pertinent positives/negatives)  Labs Reviewed  BASIC METABOLIC PANEL - Abnormal; Notable for the following components:      Result Value   Glucose, Bld 115 (*)    All other components within normal limits  URINALYSIS, COMPLETE (UACMP) WITH MICROSCOPIC - Abnormal; Notable for the following components:   Color, Urine STRAW (*)    APPearance CLEAR (*)    All other components within normal limits  CBC  TROPONIN I  ETHANOL  URINE DRUG SCREEN, QUALITATIVE ([facility] ONLY)  CBG MONITORING, ED    RADIOLOGY Images were viewed by me  CT head IMPRESSION: 1. No acute intracranial findings. 2. Progressive small vessel ischemic changes compared with remote studies from 2009. 3. Diffuse paranasal sinus inflammatory changes. ____________________________________________  DIFFERENTIAL DIAGNOSIS   CVA, Wernicke's encephalopathy, dehydration, electrolyte abnormality  FINAL ASSESSMENT AND PLAN  Weakness, chronic alcoholism   Plan: The patient had presented for left leg weakness that is worse than normal. Patient's labs did not reveal any acute process. Patient's imaging reveals small vessel ischemic changes but no acute process.  Patient will be discharged with return to the detox center ensuring that he is taking at least aspirin daily.   Ulice Dash, MD   Note: This note was generated in part or whole with voice recognition software. Voice recognition is usually quite accurate but there are transcription errors that can and very often do occur. I apologize for any typographical errors that were not detected and corrected.     Emily Filbert, MD 02/24/18 [phone]

## 2018-02-24 NOTE — ED Notes (Signed)
[facility] here for transportatoion back to facility.

## 2018-02-24 NOTE — ED Triage Notes (Addendum)
Pt arrived via ems from [facility]. Spoke with Molly Maduroobert at facility who states patient was "weaker than normal today," facility states the weakness appeared bilateral but was concerned due to patients HX (of stroke). Upon arrival to the ED patient states he feels weak since waking up this morning but denies any pain other than his chronic left leg pain from previous stroke. Pt states he went to bed last night at 2030 and did not feel as weak as he did this morning.

## 2018-02-24 NOTE — ED Notes (Signed)
[facility] contacted for transportation

## 2018-04-21 ENCOUNTER — Ambulatory Visit: Payer: Self-pay

## 2018-08-26 ENCOUNTER — Encounter: Payer: Self-pay | Admitting: Emergency Medicine

## 2018-08-26 ENCOUNTER — Emergency Department: Payer: Medicaid Other

## 2018-08-26 ENCOUNTER — Emergency Department
Admission: EM | Admit: 2018-08-26 | Discharge: 2018-08-27 | Disposition: A | Discharge status: Rehabilitation Facility | Payer: Medicaid Other | Attending: Emergency Medicine | Admitting: Emergency Medicine

## 2018-08-26 ENCOUNTER — Other Ambulatory Visit: Payer: Self-pay

## 2018-08-26 DIAGNOSIS — I2510 Atherosclerotic heart disease of native coronary artery without angina pectoris: Secondary | ICD-10-CM | POA: Insufficient documentation

## 2018-08-26 DIAGNOSIS — Z7982 Long term (current) use of aspirin: Secondary | ICD-10-CM | POA: Insufficient documentation

## 2018-08-26 DIAGNOSIS — Z8673 Personal history of transient ischemic attack (TIA), and cerebral infarction without residual deficits: Secondary | ICD-10-CM | POA: Diagnosis not present

## 2018-08-26 DIAGNOSIS — Y906 Blood alcohol level of 120-199 mg/100 ml: Secondary | ICD-10-CM | POA: Diagnosis not present

## 2018-08-26 DIAGNOSIS — F1020 Alcohol dependence, uncomplicated: Secondary | ICD-10-CM | POA: Insufficient documentation

## 2018-08-26 DIAGNOSIS — F17210 Nicotine dependence, cigarettes, uncomplicated: Secondary | ICD-10-CM | POA: Diagnosis not present

## 2018-08-26 DIAGNOSIS — Z008 Encounter for other general examination: Secondary | ICD-10-CM | POA: Diagnosis not present

## 2018-08-26 DIAGNOSIS — Z79899 Other long term (current) drug therapy: Secondary | ICD-10-CM | POA: Diagnosis not present

## 2018-08-26 LAB — CBC
HCT: 46.3 % (ref 40.0–52.0)
Hemoglobin: 16.4 g/dL (ref 13.0–18.0)
MCH: 34.8 pg — AB (ref 26.0–34.0)
MCHC: 35.4 g/dL (ref 32.0–36.0)
MCV: 98.1 fL (ref 80.0–100.0)
PLATELETS: 276 10*3/uL (ref 150–440)
RBC: 4.72 MIL/uL (ref 4.40–5.90)
RDW: 14.1 % (ref 11.5–14.5)
WBC: 9.8 10*3/uL (ref 3.8–10.6)

## 2018-08-26 LAB — URINE DRUG SCREEN, QUALITATIVE (ARMC ONLY)
Amphetamines, Ur Screen: NOT DETECTED
BARBITURATES, UR SCREEN: NOT DETECTED
BENZODIAZEPINE, UR SCRN: NOT DETECTED
CANNABINOID 50 NG, UR ~~LOC~~: NOT DETECTED
Cocaine Metabolite,Ur ~~LOC~~: NOT DETECTED
MDMA (ECSTASY) UR SCREEN: NOT DETECTED
Methadone Scn, Ur: NOT DETECTED
Opiate, Ur Screen: NOT DETECTED
Phencyclidine (PCP) Ur S: NOT DETECTED
TRICYCLIC, UR SCREEN: NOT DETECTED

## 2018-08-26 LAB — COMPREHENSIVE METABOLIC PANEL
ALBUMIN: 4.6 g/dL (ref 3.5–5.0)
ALK PHOS: 43 U/L (ref 38–126)
ALT: 22 U/L (ref 0–44)
ANION GAP: 14 (ref 5–15)
AST: 24 U/L (ref 15–41)
BILIRUBIN TOTAL: 1.2 mg/dL (ref 0.3–1.2)
BUN: <5 mg/dL — ABNORMAL LOW (ref 6–20)
CALCIUM: 9.4 mg/dL (ref 8.9–10.3)
CO2: 19 mmol/L — ABNORMAL LOW (ref 22–32)
Chloride: 107 mmol/L (ref 98–111)
Creatinine, Ser: 0.55 mg/dL — ABNORMAL LOW (ref 0.61–1.24)
GFR calc Af Amer: >60 mL/min (ref >60)
GFR calc non Af Amer: >60 mL/min (ref >60)
GLUCOSE: 87 mg/dL (ref 70–99)
Potassium: 3.9 mmol/L (ref 3.5–5.1)
Sodium: 140 mmol/L (ref 135–145)
TOTAL PROTEIN: 8 g/dL (ref 6.5–8.1)

## 2018-08-26 LAB — ETHANOL: Alcohol, Ethyl (B): 169 mg/dL — ABNORMAL HIGH (ref <10)

## 2018-08-26 LAB — TROPONIN I

## 2018-08-26 MED ORDER — LORAZEPAM 2 MG PO TABS: 0.0000 mg | ORAL_TABLET | Freq: Two times a day (BID) | ORAL | Status: DC

## 2018-08-26 MED ORDER — VITAMIN B-1 100 MG PO TABS
100.0000 mg | ORAL_TABLET | Freq: Every day | ORAL | Status: DC
  Administered 2018-08-26: 100 mg via ORAL
  Filled 2018-08-26: qty 1

## 2018-08-26 MED ORDER — THIAMINE HCL 100 MG/ML IJ SOLN: 100.0000 mg | Freq: Every day | INTRAMUSCULAR | Status: DC

## 2018-08-26 MED ORDER — LORAZEPAM 2 MG/ML IJ SOLN: 0.0000 mg | Freq: Two times a day (BID) | INTRAMUSCULAR | Status: DC

## 2018-08-26 MED ORDER — LORAZEPAM 2 MG PO TABS: 0.0000 mg | ORAL_TABLET | Freq: Four times a day (QID) | ORAL | Status: DC

## 2018-08-26 MED ORDER — LORAZEPAM 2 MG/ML IJ SOLN: 0.0000 mg | Freq: Four times a day (QID) | INTRAMUSCULAR | Status: DC

## 2018-08-26 NOTE — BH Assessment (Signed)
Per Kevin Hopkins at [facility] pt has been accepted and will be picked up tonight by [name] at 12:30am for transfer. Pt is well know to [facility] and meets criteria for their program.

## 2018-08-26 NOTE — ED Notes (Signed)
Spoke with Kevin Hopkins (from [facility]) for an update on the pt's placement status for detox. She states that she is calling now to assess progress on acceptance and will call this RN with an update ASAP.

## 2018-08-26 NOTE — ED Notes (Signed)
Pt sitting up in bed at this time, eating dinner tray. Will continue to monitor. Awaiting [facility] referral to detox facility.

## 2018-08-26 NOTE — ED Triage Notes (Signed)
Patient states he has an alcohol problem and he wants to try to stop drinking alcohol.  Patient states he had 4 beers this morning.  Patient's brother is with him at this time.  Patient denies SI and HI.  Patient's brother states, "He's got to stop drinking and we didn't know what else to do." Family reports patient has had 2 strokes in the past.  This RN told patient and family about residential treatment services in [place].

## 2018-08-26 NOTE — ED Provider Notes (Addendum)
[facility] Emergency Department Provider Note  ____________________________________________   First MD Initiated Contact with Patient 08/26/18 1504     (approximate)  I have reviewed the triage vital signs and the nursing notes.   HISTORY  Chief Complaint Alcohol Problem   HPI Kevin Hopkins is a 57 y.o. male with a history of coronary artery disease as well as CVA not on any home meds was given to the emergency department with assistance with his alcoholism.  Patient says that he drinks approximately a 12 pack/day and last drank this morning.  States that he thinks he will die if he does not seek help for his alcoholism.  He says that he was also held by his brother today to come to the hospital to seek treatment.  He denies any pain.  Denies any weakness.  Says that he also occasionally smokes marijuana.   Past Medical History:  Diagnosis Date  . Coronary artery disease   . CVA (cerebral vascular accident) [facility]     Patient Active Problem List   Diagnosis Date Noted  . Great toe pain, right 01/16/2017  . Chronic pain of both knees 01/16/2017    Past Surgical History:  Procedure Laterality Date  . CARDIAC SURGERY      Prior to Admission medications   Medication Sig Start Date End Date Taking? Authorizing Provider  aspirin 81 MG EC tablet Take 1 tablet (81 mg total) by mouth daily. Swallow whole. 02/24/18   Emily Filbert, MD  gabapentin (NEURONTIN) 600 MG tablet Take 1 tablet (600 mg total) by mouth 2 (two) times daily. 2  pills in morning 1 pill at night 01/20/18   Kallie Locks, FNP  Multiple Vitamins-Minerals (MULTIVITAMIN WITH MINERALS) tablet Take 1 tablet by mouth daily. 02/24/18 02/24/19  Emily Filbert, MD  thiamine (VITAMIN B-1) 100 MG tablet Take 1 tablet (100 mg total) by mouth 2 (two) times daily. 02/24/18   Emily Filbert, MD    Allergies Patient has no known allergies.  No family history on file.  Social  History Social History   Tobacco Use  . Smoking status: Current Every Day Smoker    Packs/day: 1.00  . Smokeless tobacco: Never Used  Substance Use Topics  . Alcohol use: Yes    Comment: 12 pack or more per day  . Drug use: No    Review of Systems  Constitutional: No fever/chills Eyes: No visual changes. ENT: No sore throat. Cardiovascular: Denies chest pain. Respiratory: Denies shortness of breath. Gastrointestinal: No abdominal pain.  No nausea, no vomiting.  No diarrhea.  No constipation. Genitourinary: Negative for dysuria. Musculoskeletal: Negative for back pain. Skin: Negative for rash. Neurological: Negative for headaches, focal weakness or numbness.   ____________________________________________   PHYSICAL EXAM:  VITAL SIGNS: ED Triage Vitals  Enc Vitals Group     BP 08/26/18 1352 (!) 140/96     Pulse Rate 08/26/18 1352 98     Resp 08/26/18 1352 18     Temp 08/26/18 1352 98.7 F (37.1 C)     Temp Source 08/26/18 1352 Oral     SpO2 08/26/18 1352 95 %     Weight 08/26/18 1353 170 lb (77.1 kg)     Height 08/26/18 1353 5\' 8"  (1.727 m)     Head Circumference --      Peak Flow --      Pain Score 08/26/18 1353 7     Pain Loc --  Pain Edu? --      Excl. in GC? --     Constitutional: Alert and oriented. Well appearing and in no acute distress. Eyes: Conjunctivae are normal.  Head: Atraumatic. Nose: No congestion/rhinnorhea. Mouth/Throat: Mucous membranes are moist.  Neck: No stridor.   Cardiovascular: Normal rate, regular rhythm. Grossly normal heart sounds.   Respiratory: Normal respiratory effort.  No retractions. Lungs CTAB. Gastrointestinal: Soft and nontender. No distention.  Musculoskeletal: No lower extremity tenderness nor edema.  No joint effusions. Neurologic:  Normal speech and language. No gross focal neurologic deficits are appreciated. Skin:  Skin is warm, dry and intact. No rash noted. Psychiatric: Mood and affect are normal. Speech  and behavior are normal.  ____________________________________________   LABS (all labs ordered are listed, but only abnormal results are displayed)  Labs Reviewed  COMPREHENSIVE METABOLIC PANEL - Abnormal; Notable for the following components:      Result Value   CO2 19 (*)    BUN <5 (*)    Creatinine, Ser 0.55 (*)    All other components within normal limits  ETHANOL - Abnormal; Notable for the following components:   Alcohol, Ethyl (B) 169 (*)    All other components within normal limits  CBC - Abnormal; Notable for the following components:   MCH 34.8 (*)    All other components within normal limits  URINE DRUG SCREEN, QUALITATIVE ([facility] ONLY)  TROPONIN I   ____________________________________________  EKG  ED ECG REPORT I, Arelia Longest, the attending physician, personally viewed and interpreted this ECG.   Date: 08/26/2018  EKG Time: 1402  Rate: 92  Rhythm: normal sinus rhythm  Axis: Normal  Intervals:none  ST&T Change: No ST segment elevation or depression.  No abnormal T wave inversion.   ____________________________________________  RADIOLOGY  Chest x-ray with likely nipple shadows.  Recommend repeat with nipple markers.  No other acute finding. ____________________________________________   PROCEDURES  Procedure(s) performed:   Procedures  Critical Care performed:   ____________________________________________   INITIAL IMPRESSION / ASSESSMENT AND PLAN / ED COURSE  Pertinent labs & imaging results that were available during my care of the patient were reviewed by me and considered in my medical decision making (see chart for details).  DDX: Alcoholism, medication noncompliance, alcohol withdrawal, alcohol abuse, polysubstance abuse As part of my medical decision making, I reviewed the following data within the electronic MEDICAL RECORD NUMBER Notes from prior ED visits  [facility] consultation placed.  Pending [facility] consultation at this time for  possible rehab/detox placement. ____________________________________________   FINAL CLINICAL IMPRESSION(S) / ED DIAGNOSES  Alcoholism  NEW MEDICATIONS STARTED DURING THIS VISIT:  New Prescriptions   No medications on file     Note:  This document was prepared using Dragon voice recognition software and may include unintentional dictation errors.     Myrna Blazer, MD 08/26/18 1621.  Patient pending disposition to [facility] approximately 12:30 AM.    Myrna Blazer, MD 08/26/18 2321

## 2018-08-26 NOTE — Discharge Instructions (Addendum)
Proceed directly to [facility].  Return to the ER for worsening symptoms, persistent vomiting, difficulty breathing or other concerns. °

## 2018-08-26 NOTE — BH Assessment (Signed)
Assessment Note  Kevin Hopkins is an 57 y.o. male who presents to the ER due to his alcohol use. Patient reports of drinking alcohol on daily basis. He start drinking at the age of 88 and the longest he's been sober is two years. The amount he currently drinks is  6 pack of 12oz beers. Patient reports of using cannabis, once a month but it's only when someone offers it to him. He denies the use of any other mind-altering substances. His reported symptom of withdrawals are; headaches, shakes, some nausea. He denies history of seizures and blackouts. Patient is able to walk and complete his ADL's without assistance "but not as fast as I use to." Patient reports of having had a stroke in the 1990's. He states he have no current complaints about it.  During the interview, the patient was calm, cooperative and pleasant. He was able to provide appropriate answers to the questions. Patient also denies involvement with the legal system and have no history of violence or aggression.  Patient also denies SI/HI and AV/H.  Upon arrival the patient BAC was 169 and UDS was negative.   Diagnosis: Alcohol Use Disorder  Past Medical History:  Past Medical History:  Diagnosis Date  . Coronary artery disease   . CVA (cerebral vascular accident) [facility])     Past Surgical History:  Procedure Laterality Date  . CARDIAC SURGERY      Family History: No family history on file.  Social History:  reports that he has been smoking. He has been smoking about 1.00 pack per day. He has never used smokeless tobacco. He reports that he drinks alcohol. He reports that he does not use drugs.  Additional Social History:  Alcohol / Drug Use Pain Medications: See PTA Prescriptions: See PTA Over the Counter: See PTA History of alcohol / drug use?: Yes Longest period of sobriety (when/how long): Two years Negative Consequences of Use: Personal relationships, Financial Substance #1 Name of Substance 1: Alcohol 1 - Age of  First Use: 17 1 - Amount (size/oz): "6 pacak (12oz)" 1 - Frequency: Daily 1 - Duration: Unable to quantify 1 - Last Use / Amount: 08/26/2018  CIWA: CIWA-Ar BP: (!) 136/96 Pulse Rate: 83 Nausea and Vomiting: no nausea and no vomiting Tactile Disturbances: none Tremor: no tremor Auditory Disturbances: not present Paroxysmal Sweats: no sweat visible Visual Disturbances: not present Anxiety: no anxiety, at ease Headache, Fullness in Head: none present Agitation: normal activity Orientation and Clouding of Sensorium: disoriented for date by more than 2 calendar days CIWA-Ar Total: 3 COWS:    Allergies: No Known Allergies  Home Medications:  (Not in a hospital admission)  OB/GYN Status:  No LMP for male patient.  General Assessment Data Location of Assessment: [facility] ED TTS Assessment: In system Is this a Tele or Face-to-Face Assessment?: Face-to-Face Is this an Initial Assessment or a Re-assessment for this encounter?: Initial Assessment Patient Accompanied by:: (Self) Language Other than English: No Living Arrangements: (Private home) What gender do you identify as?: Male Marital status: Divorced Pregnancy Status: No Living Arrangements: Alone Can pt return to current living arrangement?: Yes Admission Status: Voluntary Is patient capable of signing voluntary admission?: Yes Referral Source: Self/Family/Friend Insurance type: Medicaid  Medical Screening Exam [facility] Walk-in ONLY) Medical Exam completed: Yes  Crisis Care Plan Living Arrangements: Alone Legal Guardian: Other: Name of Psychiatrist: Reports of none Name of Therapist: Reports of none  Education Status Is patient currently in school?: No Is the patient employed, unemployed  or receiving disability?: Unemployed  Risk to self with the past 6 months Suicidal Ideation: No Has patient been a risk to self within the past 6 months prior to admission? : No Suicidal Intent: No Has patient had any suicidal  intent within the past 6 months prior to admission? : No Is patient at risk for suicide?: No Suicidal Plan?: No Has patient had any suicidal plan within the past 6 months prior to admission? : No Access to Means: No What has been your use of drugs/alcohol within the last 12 months?: Alcohol Previous Attempts/Gestures: No How many times?: 0 Other Self Harm Risks: Active addiction Triggers for Past Attempts: None known Intentional Self Injurious Behavior: None Family Suicide History: No Recent stressful life event(s): Other (Comment), Financial Problems Persecutory voices/beliefs?: No Depression: Yes Depression Symptoms: Guilt, Feeling worthless/self pity Substance abuse history and/or treatment for substance abuse?: Yes Suicide prevention information given to non-admitted patients: Not applicable  Risk to Others within the past 6 months Homicidal Ideation: No Does patient have any lifetime risk of violence toward others beyond the six months prior to admission? : No Thoughts of Harm to Others: No Current Homicidal Intent: No Current Homicidal Plan: No Access to Homicidal Means: No Identified Victim: Reports of none History of harm to others?: No Assessment of Violence: None Noted Violent Behavior Description: Reports of none Does patient have access to weapons?: No Criminal Charges Pending?: No Does patient have a court date: No Is patient on probation?: No  Psychosis Hallucinations: None noted Delusions: None noted  Mental Status Report Appearance/Hygiene: Unremarkable Eye Contact: Fair Motor Activity: Freedom of movement, Unremarkable Speech: Logical/coherent, Unremarkable Level of Consciousness: Alert Mood: Anxious, Sad, Pleasant Affect: Appropriate to circumstance Anxiety Level: Minimal Thought Processes: Coherent, Relevant Judgement: Unimpaired Orientation: Person, Place, Time, Situation, Appropriate for developmental age Obsessive Compulsive Thoughts/Behaviors:  Minimal  Cognitive Functioning Concentration: Normal Memory: Recent Intact, Remote Intact Is patient IDD: No Insight: Fair Impulse Control: Fair Appetite: Good Have you had any weight changes? : No Change Sleep: No Change Total Hours of Sleep: 8 Vegetative Symptoms: None  ADLScreening [facility] Assessment Services) Patient's cognitive ability adequate to safely complete daily activities?: Yes Patient able to express need for assistance with ADLs?: Yes Independently performs ADLs?: Yes (appropriate for developmental age)  Prior Inpatient Therapy Prior Inpatient Therapy: Yes Prior Therapy Dates: 1980 Prior Therapy Facilty/Provider(s): [facility] Reason for Treatment: Substance Treatment  Prior Outpatient Therapy Prior Outpatient Therapy: No Does patient have an ACCT team?: No Does patient have Intensive In-House Services?  : No Does patient have [facility] services? : No Does patient have P4CC services?: No  ADL Screening (condition at time of admission) Patient's cognitive ability adequate to safely complete daily activities?: Yes Is the patient deaf or have difficulty hearing?: No Does the patient have difficulty seeing, even when wearing glasses/contacts?: No Does the patient have difficulty concentrating, remembering, or making decisions?: No Patient able to express need for assistance with ADLs?: Yes Does the patient have difficulty dressing or bathing?: No Independently performs ADLs?: Yes (appropriate for developmental age) Does the patient have difficulty walking or climbing stairs?: No Weakness of Legs: Left Weakness of Arms/Hands: Left  Home Assistive Devices/Equipment Home Assistive Devices/Equipment: None  Therapy Consults (therapy consults require a physician order) PT Evaluation Needed: No OT Evalulation Needed: No SLP Evaluation Needed: No Abuse/Neglect Assessment (Assessment to be complete while patient is alone) Abuse/Neglect Assessment Can Be Completed:  Yes Physical Abuse: Denies Verbal Abuse: Denies Sexual Abuse: Denies Exploitation of  patient/patient's resources: Denies Self-Neglect: Denies Values / Beliefs Cultural Requests During Hospitalization: None Spiritual Requests During Hospitalization: None Consults Spiritual Care Consult Needed: No Social Work Consult Needed: No Merchant navy officer (For Healthcare) Does Patient Have a Medical Advance Directive?: No Would patient like information on creating a medical advance directive?: No - Patient declined       Child/Adolescent Assessment Running Away Risk: Denies(Patient is an adult)  Disposition:  Disposition Initial Assessment Completed for this Encounter: Yes Patient referred to: [facility](Detox Facility)  On Site Evaluation by:   Reviewed with Physician:    Lilyan Gilford MS, LCAS, LPC, NCC, CCSI Therapeutic Triage Specialist 08/26/2018 5:26 PM

## 2018-08-26 NOTE — ED Notes (Signed)
[place], [facility] at bedside at this time.  

## 2018-08-26 NOTE — BH Assessment (Signed)
Writer discussed with the patient about the option for detox/treatment.  Patient was in the agreement with the plan.  Patient signed the Release of information, in order to send labs and ER Note.  Information sent sent to [facility] & [facility] via fax

## 2018-08-26 NOTE — ED Notes (Signed)
Pt presents to ED due to "I'm drinking a lot". Pt reports how much he drinks depends on "how much money [he] has, but endorses drinking no less than a 12 pack/day. Pt states last drink was this morning. Pt is alert and oriented at this time, denies further complaints at this time.

## 2018-08-26 NOTE — ED Notes (Signed)
Pt updated on plan of care and notified him that [facility] to arrive around 1230 am this morning for transport to accepting detox facility.

## 2018-08-27 NOTE — ED Provider Notes (Signed)
-----------------------------------------   1:40 AM on 08/27/2018 -----------------------------------------  Patient being transported to [facility] now in good condition.   Irean Hong, MD 08/27/18 [id number]

## 2018-08-27 NOTE — ED Notes (Signed)
Call received from [place], TTS, at this time who states that [facility] transport will arrive closer to 130am instead of around 1230am as reported earlier.

## 2018-09-16 ENCOUNTER — Telehealth: Payer: Self-pay

## 2018-09-16 NOTE — Telephone Encounter (Signed)
-----   Message from Rolm Gala, NP sent at 09/08/2018 12:12 PM EDT ----- Please call and schedule a follow up appointment. Thank you

## 2018-09-16 NOTE — Telephone Encounter (Signed)
Called pt to check in and see if pt needed an appt. Pt has insurance now. I explained to pt he is ineligible for our clinic now but if he needs [place] in the future to call us.

## 2019-08-28 ENCOUNTER — Encounter: Payer: Self-pay | Admitting: Emergency Medicine

## 2019-08-28 ENCOUNTER — Other Ambulatory Visit: Payer: Self-pay

## 2019-08-28 ENCOUNTER — Emergency Department: Payer: Medicaid Other

## 2019-08-28 ENCOUNTER — Emergency Department
Admission: EM | Admit: 2019-08-28 | Discharge: 2019-08-28 | Disposition: A | Discharge status: Home / Self Care | Payer: Medicaid Other | Attending: Emergency Medicine | Admitting: Emergency Medicine

## 2019-08-28 DIAGNOSIS — Y998 Other external cause status: Secondary | ICD-10-CM | POA: Diagnosis not present

## 2019-08-28 DIAGNOSIS — Z7982 Long term (current) use of aspirin: Secondary | ICD-10-CM | POA: Diagnosis not present

## 2019-08-28 DIAGNOSIS — Y9389 Activity, other specified: Secondary | ICD-10-CM | POA: Insufficient documentation

## 2019-08-28 DIAGNOSIS — S67193A Crushing injury of left middle finger, initial encounter: Secondary | ICD-10-CM | POA: Insufficient documentation

## 2019-08-28 DIAGNOSIS — W231XXA Caught, crushed, jammed, or pinched between stationary objects, initial encounter: Secondary | ICD-10-CM | POA: Diagnosis not present

## 2019-08-28 DIAGNOSIS — F17210 Nicotine dependence, cigarettes, uncomplicated: Secondary | ICD-10-CM | POA: Diagnosis not present

## 2019-08-28 DIAGNOSIS — S6992XA Unspecified injury of left wrist, hand and finger(s), initial encounter: Secondary | ICD-10-CM | POA: Diagnosis present

## 2019-08-28 DIAGNOSIS — Y92812 Truck as the place of occurrence of the external cause: Secondary | ICD-10-CM | POA: Insufficient documentation

## 2019-08-28 DIAGNOSIS — Z23 Encounter for immunization: Secondary | ICD-10-CM | POA: Insufficient documentation

## 2019-08-28 DIAGNOSIS — Z8673 Personal history of transient ischemic attack (TIA), and cerebral infarction without residual deficits: Secondary | ICD-10-CM | POA: Diagnosis not present

## 2019-08-28 DIAGNOSIS — S62639B Displaced fracture of distal phalanx of unspecified finger, initial encounter for open fracture: Secondary | ICD-10-CM | POA: Insufficient documentation

## 2019-08-28 MED ORDER — LIDOCAINE HCL (PF) 1 % IJ SOLN
5.0000 mL | Freq: Once | INTRAMUSCULAR | Status: AC
  Administered 2019-08-28: 13:00:00 5 mL
  Filled 2019-08-28: qty 5

## 2019-08-28 MED ORDER — SULFAMETHOXAZOLE-TRIMETHOPRIM 800-160 MG PO TABS
1.0000 | ORAL_TABLET | Freq: Once | ORAL | Status: AC
  Administered 2019-08-28: 13:00:00 1 via ORAL
  Filled 2019-08-28: qty 1

## 2019-08-28 MED ORDER — SULFAMETHOXAZOLE-TRIMETHOPRIM 800-160 MG PO TABS: 1.0000 | ORAL_TABLET | Freq: Two times a day (BID) | ORAL | 0 refills | Status: AC

## 2019-08-28 MED ORDER — TETANUS-DIPHTH-ACELL PERTUSSIS 5-2.5-18.5 LF-MCG/0.5 IM SUSP
0.5000 mL | Freq: Once | INTRAMUSCULAR | Status: AC
  Administered 2019-08-28: 13:00:00 0.5 mL via INTRAMUSCULAR
  Filled 2019-08-28: qty 0.5

## 2019-08-28 NOTE — ED Notes (Signed)
First Nurse Note: Pt to ED for finger injury. Pt states that they were unloading a bush hog last night and the bucket fell off onto his left hand, cutting left middle finger. States that this happened last night around 2030. Finger is still oozing blood at this time. Pt is in NAD.

## 2019-08-28 NOTE — Discharge Instructions (Addendum)
Because you were delayed in coming to the ED, we were not able to repair your finger laceration with sutures. Keep the wound and dressing clean and dry. Wear the splint to keep the finger straight. Take the antibiotic as directed. Follow-up with [facility], or Dr. Roland Rack as needed. You may return to the ED or any local urgent care center for wound check.
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
## 2019-08-28 NOTE — ED Notes (Signed)
Pt c/o left hand pain and has multiple lacerations to his left fingers d/t moving a tractor off a trailer last night.

## 2019-08-28 NOTE — ED Triage Notes (Signed)
States got hand caught between bucket and tractor last night, caught all fingers.

## 2020-02-03 ENCOUNTER — Other Ambulatory Visit: Payer: Self-pay | Admitting: Family Medicine

## 2020-02-03 ENCOUNTER — Ambulatory Visit
Admission: RE | Admit: 2020-02-03 | Discharge: 2020-02-03 | Disposition: A | Discharge status: Home / Self Care | Payer: Medicaid Other | Source: Ambulatory Visit | Attending: Family Medicine | Admitting: Family Medicine

## 2020-02-03 ENCOUNTER — Ambulatory Visit
Admission: RE | Admit: 2020-02-03 | Discharge: 2020-02-03 | Disposition: A | Discharge status: Home / Self Care | Payer: Medicaid Other | Attending: Family Medicine | Admitting: Family Medicine

## 2020-02-03 DIAGNOSIS — M25562 Pain in left knee: Secondary | ICD-10-CM

## 2020-02-03 DIAGNOSIS — M25561 Pain in right knee: Secondary | ICD-10-CM | POA: Diagnosis not present

## 2020-11-04 ENCOUNTER — Other Ambulatory Visit: Payer: Self-pay

## 2020-11-04 ENCOUNTER — Emergency Department: Payer: Medicaid Other

## 2020-11-04 ENCOUNTER — Emergency Department
Admission: EM | Admit: 2020-11-04 | Discharge: 2020-11-04 | Disposition: A | Discharge status: Home / Self Care | Payer: Medicaid Other | Attending: Emergency Medicine | Admitting: Emergency Medicine

## 2020-11-04 DIAGNOSIS — M79605 Pain in left leg: Secondary | ICD-10-CM | POA: Insufficient documentation

## 2020-11-04 DIAGNOSIS — F10120 Alcohol abuse with intoxication, uncomplicated: Secondary | ICD-10-CM | POA: Diagnosis not present

## 2020-11-04 DIAGNOSIS — I2510 Atherosclerotic heart disease of native coronary artery without angina pectoris: Secondary | ICD-10-CM | POA: Diagnosis not present

## 2020-11-04 DIAGNOSIS — Z7982 Long term (current) use of aspirin: Secondary | ICD-10-CM | POA: Diagnosis not present

## 2020-11-04 DIAGNOSIS — F17200 Nicotine dependence, unspecified, uncomplicated: Secondary | ICD-10-CM | POA: Diagnosis not present

## 2020-11-04 DIAGNOSIS — F10920 Alcohol use, unspecified with intoxication, uncomplicated: Secondary | ICD-10-CM

## 2020-11-04 MED ORDER — MELOXICAM 7.5 MG PO TABS
15.0000 mg | ORAL_TABLET | Freq: Once | ORAL | Status: AC
  Administered 2020-11-04: 15 mg via ORAL
  Filled 2020-11-04 (×2): qty 2

## 2020-11-04 MED ORDER — MELOXICAM 15 MG PO TABS: 15.0000 mg | ORAL_TABLET | Freq: Every day | ORAL | 0 refills | Status: AC

## 2020-11-04 NOTE — ED Notes (Signed)
Pt needs assistance walking - unsteady due to intoxication and leg pain.

## 2020-11-04 NOTE — ED Notes (Signed)
Pt given meal tray.

## 2020-11-04 NOTE — ED Notes (Signed)
Pt returned from X-ray.  

## 2020-11-04 NOTE — ED Notes (Signed)
Pt taken to Xray.

## 2020-11-04 NOTE — ED Triage Notes (Signed)
Pt to ED EMS for chief complaint of left leg pain x2 days.  Reports drinking 3-4 12 oz beers today

## 2020-11-04 NOTE — ED Triage Notes (Signed)
First Nurse Note: C/O left leg pain x 2 days.  Arrives via [facility].  Per report, patient has ETOH on board.  VS wnl

## 2020-11-04 NOTE — ED Provider Notes (Signed)
[facility] Emergency Department Provider Note  ____________________________________________  Time seen: Approximately 6:20 PM  I have reviewed the triage vital signs and the nursing notes.   HISTORY  Chief Complaint Leg Pain    HPI Kevin Hopkins is a 59 y.o. male who presents the emergency department for complaint of left knee pain. Patient does appear heavily intoxicated and does admit to alcohol use prior to arrival.  Patient is somewhat hard to keep focused on current complaint.  Limited history in regards to nature of pain.  He states that he is ambulatory but cannot describe the pain whether it is sharp, dull, aching.  Patient states that he had a stroke multiple years ago, has had residual problems with his left side but is unable to describe what these "problems" are..  Patient states that he is now having pain with his left knee with ambulation.  There is no reported injury.  He states that this is chronic but it has been worse over the past several days.  Patient denies any weakness to the left side of the body.  No headache, chest pain, abdominal pain.  No reported medications for this complaint prior to arrival.          Past Medical History:  Diagnosis Date  . Coronary artery disease   . CVA (cerebral vascular accident) [facility])     Patient Active Problem List   Diagnosis Date Noted  . Great toe pain, right 01/16/2017  . Chronic pain of both knees 01/16/2017    Past Surgical History:  Procedure Laterality Date  . CARDIAC SURGERY      Prior to Admission medications   Medication Sig Start Date End Date Taking? Authorizing Provider  aspirin 81 MG EC tablet Take 1 tablet (81 mg total) by mouth daily. Swallow whole. 02/24/18   Emily Filbert, MD  gabapentin (NEURONTIN) 600 MG tablet Take 1 tablet (600 mg total) by mouth 2 (two) times daily. 2  pills in morning 1 pill at night 01/20/18   Kallie Locks, FNP  meloxicam (MOBIC) 15 MG tablet Take  1 tablet (15 mg total) by mouth daily. 11/04/20   Serafin Decatur, Delorise Royals, PA-C  sulfamethoxazole-trimethoprim (BACTRIM DS) 800-160 MG tablet Take 1 tablet by mouth 2 (two) times daily. 08/28/19   Menshew, Charlesetta Ivory, PA-C  thiamine (VITAMIN B-1) 100 MG tablet Take 1 tablet (100 mg total) by mouth 2 (two) times daily. 02/24/18   Emily Filbert, MD    Allergies Patient has no known allergies.  No family history on file.  Social History Social History   Tobacco Use  . Smoking status: Current Every Day Smoker    Packs/day: 1.00  . Smokeless tobacco: Never Used  Substance Use Topics  . Alcohol use: Yes    Comment: 12 pack or more per day  . Drug use: No     Review of Systems  Constitutional: No fever/chills Eyes: No visual changes. No discharge ENT: No upper respiratory complaints. Cardiovascular: no chest pain. Respiratory: no cough. No SOB. Gastrointestinal: No abdominal pain.  No nausea, no vomiting.  No diarrhea.  No constipation. Musculoskeletal: Left knee pain Skin: Negative for rash, abrasions, lacerations, ecchymosis. Neurological: Negative for headaches, focal weakness or numbness.  10 System ROS otherwise negative.  ____________________________________________   PHYSICAL EXAM:  VITAL SIGNS: ED Triage Vitals  Enc Vitals Group     BP 11/04/20 1800 (!) 130/91     Pulse Rate 11/04/20 1800 (!) 104  Resp 11/04/20 1800 18     Temp 11/04/20 1759 98.8 F (37.1 C)     Temp Source 11/04/20 1759 Oral     SpO2 11/04/20 1800 96 %     Weight 11/04/20 1800 160 lb (72.6 kg)     Height 11/04/20 1800 5\' 7"  (1.702 m)     Head Circumference --      Peak Flow --      Pain Score 11/04/20 1759 5     Pain Loc --      Pain Edu? --      Excl. in GC? --      Constitutional: Alert and oriented. Well appearing and in no acute distress. Eyes: Conjunctivae are normal. PERRL. EOMI. Head: Atraumatic. ENT:      Ears:       Nose: No congestion/rhinnorhea.       Mouth/Throat: Mucous membranes are moist.  Neck: No stridor.    Cardiovascular: Normal rate, regular rhythm. Normal S1 and S2.  Good peripheral circulation. Respiratory: Normal respiratory effort without tachypnea or retractions. Lungs CTAB. Good air entry to the bases with no decreased or absent breath sounds. Musculoskeletal: Full range of motion to all extremities. No gross deformities appreciated.  No visible signs of trauma to the knee with edema, abrasions, ecchymosis.  No deformity.  Patient is able to extend and flex the knee appropriately.  Palpation does not seem to elicit any tenderness over the left lower extremity including the left knee.  Varus, valgus, Lachman's and McMurray's is negative.  Dorsalis pedis pulse intact.  Sensation intact distally. Neurologic:  Normal speech and language. No gross focal neurologic deficits are appreciated.  Skin:  Skin is warm, dry and intact. No rash noted. Psychiatric: Mood and affect are normal. Speech and behavior are normal. Patient exhibits appropriate insight and judgement.   ____________________________________________   LABS (all labs ordered are listed, but only abnormal results are displayed)  Labs Reviewed - No data to display ____________________________________________  EKG   ____________________________________________  RADIOLOGY I personally viewed and evaluated these images as part of my medical decision making, as well as reviewing the written report by the radiologist.  ED Provider Interpretation: No acute findings on the hip or knee x-ray  DG Knee Complete 4 Views Left  Result Date: 11/04/2020 CLINICAL DATA:  Acute on chronic knee pain EXAM: LEFT KNEE - COMPLETE 4+ VIEW COMPARISON:  02/03/2020 FINDINGS: No acute bony abnormality. Specifically, no fracture, subluxation, or dislocation. Early spurring. No joint effusion. IMPRESSION: No acute bony abnormality. Electronically Signed   By: 04/04/2020 M.D.   On: 11/04/2020  19:25   DG Hip Unilat W or Wo Pelvis 2-3 Views Left  Result Date: 11/04/2020 CLINICAL DATA:  Left hip pain. EXAM: DG HIP (WITH OR WITHOUT PELVIS) 2-3V LEFT COMPARISON:  None. FINDINGS: There are mild degenerative changes of both hips. There is no acute displaced fracture or dislocation. IMPRESSION: Mild degenerative changes of both hips. Electronically Signed   By: 14/09/2020 M.D.   On: 11/04/2020 18:55    ____________________________________________    PROCEDURES  Procedure(s) performed:    Procedures    Medications  meloxicam (MOBIC) tablet 15 mg (has no administration in time range)     ____________________________________________   INITIAL IMPRESSION / ASSESSMENT AND PLAN / ED COURSE  Pertinent labs & imaging results that were available during my care of the patient were reviewed by me and considered in my medical decision making (see chart for details).  Review  of the [place] CSRS was performed in accordance of the [facility] prior to dispensing any controlled drugs.           Patient's diagnosis is consistent with leg pain.  Patient presented to the emergency department complaining of worsening left leg pain.  Patient was a very poor historian, was clearly intoxicated.  Patient was complaining of a worsening leg pain to the left leg.  He states that this is been ongoing times years following his stroke but had worsened over the past couple days without trauma.  Physical exam was unremarkable.  Imaging revealed no acute findings.  Patient was neurovascularly intact.  Differential included fracture, dislocation, muscle strain, bursitis, arthritis, DVT.  This time no indication for further work-up.  Anti-inflammatory for symptom relief.  Follow-up primary care as needed. Patient is given ED precautions to return to the ED for any worsening or new symptoms.     ____________________________________________  FINAL CLINICAL IMPRESSION(S) / ED DIAGNOSES  Final diagnoses:   Left leg pain  Alcoholic intoxication without complication (HCC)      NEW MEDICATIONS STARTED DURING THIS VISIT:  ED Discharge Orders         Ordered    meloxicam (MOBIC) 15 MG tablet  Daily        11/04/20 1945              This chart was dictated using voice recognition software/Dragon. Despite best efforts to proofread, errors can occur which can change the meaning. Any change was purely unintentional.    Racheal Patches, PA-C 11/04/20 1945    Sharman Cheek, MD 11/04/20 2040

## 2020-11-06 ENCOUNTER — Other Ambulatory Visit: Payer: Self-pay

## 2020-11-06 ENCOUNTER — Encounter: Payer: Self-pay | Admitting: *Deleted

## 2020-11-06 ENCOUNTER — Emergency Department
Admission: EM | Admit: 2020-11-06 | Discharge: 2020-11-06 | Disposition: A | Discharge status: Home / Self Care | Payer: Medicaid Other | Attending: Student in an Organized Health Care Education/Training Program | Admitting: Student in an Organized Health Care Education/Training Program

## 2020-11-06 DIAGNOSIS — Z7901 Long term (current) use of anticoagulants: Secondary | ICD-10-CM | POA: Diagnosis not present

## 2020-11-06 DIAGNOSIS — G8929 Other chronic pain: Secondary | ICD-10-CM | POA: Diagnosis not present

## 2020-11-06 DIAGNOSIS — F17200 Nicotine dependence, unspecified, uncomplicated: Secondary | ICD-10-CM | POA: Diagnosis not present

## 2020-11-06 DIAGNOSIS — W19XXXA Unspecified fall, initial encounter: Secondary | ICD-10-CM | POA: Diagnosis not present

## 2020-11-06 DIAGNOSIS — I2510 Atherosclerotic heart disease of native coronary artery without angina pectoris: Secondary | ICD-10-CM | POA: Diagnosis not present

## 2020-11-06 DIAGNOSIS — M79605 Pain in left leg: Secondary | ICD-10-CM | POA: Diagnosis not present

## 2020-11-06 DIAGNOSIS — S60222A Contusion of left hand, initial encounter: Secondary | ICD-10-CM | POA: Diagnosis not present

## 2020-11-06 DIAGNOSIS — S6992XA Unspecified injury of left wrist, hand and finger(s), initial encounter: Secondary | ICD-10-CM | POA: Diagnosis present

## 2020-11-06 NOTE — ED Provider Notes (Signed)
[facility] Emergency Department Provider Note ____________________________________________  Time seen: 2139  I have reviewed the triage vital signs and the nursing notes.  HISTORY  Chief Complaint  Fall   HPI Matheu Ploeger is a 59 y.o. male presents to the ED for evaluation of hand pain s/p fall, and chronic left leg pain.  Patient was evaluated 2 days ago for the same incident.  He presents today for evaluation of some abrasions to his left hand reported a mechanical fall related to his left-sided weakness.  He denies any head injury or loss of consciousness.  He also denies any preceding dizziness or weakness.   Patient is requesting to be allowed to sleep in the room during his ER course.  He is also requesting something to eat noting he has not eaten all day.  Patient is otherwise without any acute complaints.  Past Medical History:  Diagnosis Date  . Coronary artery disease   . CVA (cerebral vascular accident) [facility])     Patient Active Problem List   Diagnosis Date Noted  . Great toe pain, right 01/16/2017  . Chronic pain of both knees 01/16/2017    Past Surgical History:  Procedure Laterality Date  . CARDIAC SURGERY      Prior to Admission medications   Medication Sig Start Date End Date Taking? Authorizing Provider  aspirin 81 MG EC tablet Take 1 tablet (81 mg total) by mouth daily. Swallow whole. 02/24/18   Emily Filbert, MD  gabapentin (NEURONTIN) 600 MG tablet Take 1 tablet (600 mg total) by mouth 2 (two) times daily. 2  pills in morning 1 pill at night 01/20/18   Kallie Locks, FNP  meloxicam (MOBIC) 15 MG tablet Take 1 tablet (15 mg total) by mouth daily. 11/04/20   Cuthriell, Delorise Royals, PA-C  sulfamethoxazole-trimethoprim (BACTRIM DS) 800-160 MG tablet Take 1 tablet by mouth 2 (two) times daily. 08/28/19   Abriel Hattery, Charlesetta Ivory, PA-C  thiamine (VITAMIN B-1) 100 MG tablet Take 1 tablet (100 mg total) by mouth 2 (two) times daily. 02/24/18    Emily Filbert, MD    Allergies Patient has no known allergies.  History reviewed. No pertinent family history.  Social History Social History   Tobacco Use  . Smoking status: Current Every Day Smoker    Packs/day: 1.00  . Smokeless tobacco: Never Used  Substance Use Topics  . Alcohol use: Yes    Comment: 12 pack or more per day  . Drug use: No    Review of Systems  Constitutional: Negative for fever. Eyes: Negative for visual changes. ENT: Negative for sore throat. Cardiovascular: Negative for chest pain. Respiratory: Negative for shortness of breath. Gastrointestinal: Negative for abdominal pain, vomiting and diarrhea. Genitourinary: Negative for dysuria. Musculoskeletal: Negative for back pain. Skin: Negative for rash.  Abrasions to the left hand as above. Neurological: Negative for headaches, focal weakness or numbness. ____________________________________________  PHYSICAL EXAM:  VITAL SIGNS: ED Triage Vitals  Enc Vitals Group     BP 11/06/20 2040 127/82     Pulse Rate 11/06/20 2040 96     Resp 11/06/20 2040 16     Temp 11/06/20 2040 98.6 F (37 C)     Temp Source 11/06/20 2040 Oral     SpO2 11/06/20 2040 96 %     Weight 11/06/20 2041 170 lb (77.1 kg)     Height 11/06/20 2041 5\' 7"  (1.702 m)     Head Circumference --  Peak Flow --      Pain Score 11/06/20 2040 8     Pain Loc --      Pain Edu? --      Excl. in GC? --     Constitutional: Alert and oriented. Well appearing and in no distress. Patient is asleep prior to exam.  Head: Normocephalic and atraumatic. Eyes: Conjunctivae are normal. Normal extraocular movements Cardiovascular: Normal rate, regular rhythm. Normal distal pulses. Respiratory: Normal respiratory effort. No wheezes/rales/rhonchi. Gastrointestinal: Soft and nontender. No distention. Musculoskeletal: Nontender with normal range of motion in all extremities.  Neurologic:  Normal gait without ataxia. Normal speech and  language. No gross focal neurologic deficits are appreciated. Skin:  Skin is warm, dry and intact. No rash noted.  Multiple superficial abrasions to the left hand. Psychiatric: Mood and affect are normal. Patient exhibits appropriate insight and judgment. ____________________________________________  PROCEDURES  Sandwich tray as requested  Procedures ____________________________________________  INITIAL IMPRESSION / ASSESSMENT AND PLAN / ED COURSE  Patient with ED evaluation of ongoing complaints following mechanical fall 2 days prior.  Patient presents with no acute complaints, continue to note chronic leg pain and abrasion to the left hand.  Patient is otherwise stable at this time.  He is given a sandwich tray as requested, and is discharged to follow-up with primary provider.  No acute or life-threatening condition is found on his evaluation today.  Alexanderjames Berg was evaluated in Emergency Department on 11/06/2020 for the symptoms described in the history of present illness. He was evaluated in the context of the global COVID-19 pandemic, which necessitated consideration that the patient might be at risk for infection with the SARS-CoV-2 virus that causes COVID-19. Institutional protocols and algorithms that pertain to the evaluation of patients at risk for COVID-19 are in a state of rapid change based on information released by regulatory bodies including the CDC and federal and state organizations. These policies and algorithms were followed during the patient's care in the ED. ____________________________________________  FINAL CLINICAL IMPRESSION(S) / ED DIAGNOSES  Final diagnoses:  Fall, initial encounter  Contusion of left hand, initial encounter      Lissa Hoard, PA-C 11/06/20 2233    Willy Eddy, MD 11/06/20 2258

## 2020-11-06 NOTE — Discharge Instructions (Addendum)
Be sure to follow-up with your primary provider for ongoing symptom management.  Return to the ED if needed.

## 2020-11-06 NOTE — ED Triage Notes (Signed)
Pt to ED via EMS after a fall. Pt report his left leg "gave out" on him. Pt reports this has been happening since he had his last stroke. Pt was reportedly walking to [facility] and is reported to have ETOH on board.   Pt fell into rocks and has multiple small abrasions to his left hand. No bleeding noted but pt reports pain at this time. Movement and sensation intact. Left leg pain also reported but pt reports this is chronic pain no acute pain since the fall.

## 2021-05-25 IMAGING — CR DG KNEE COMPLETE 4+V*L*
4 series · 4 of 4 positions shown · non-contrast
Comparison: 02/03/2020

CLINICAL DATA: Acute on chronic knee pain

EXAM:
LEFT KNEE - COMPLETE 4+ VIEW

[knee obl (1 of 3)]
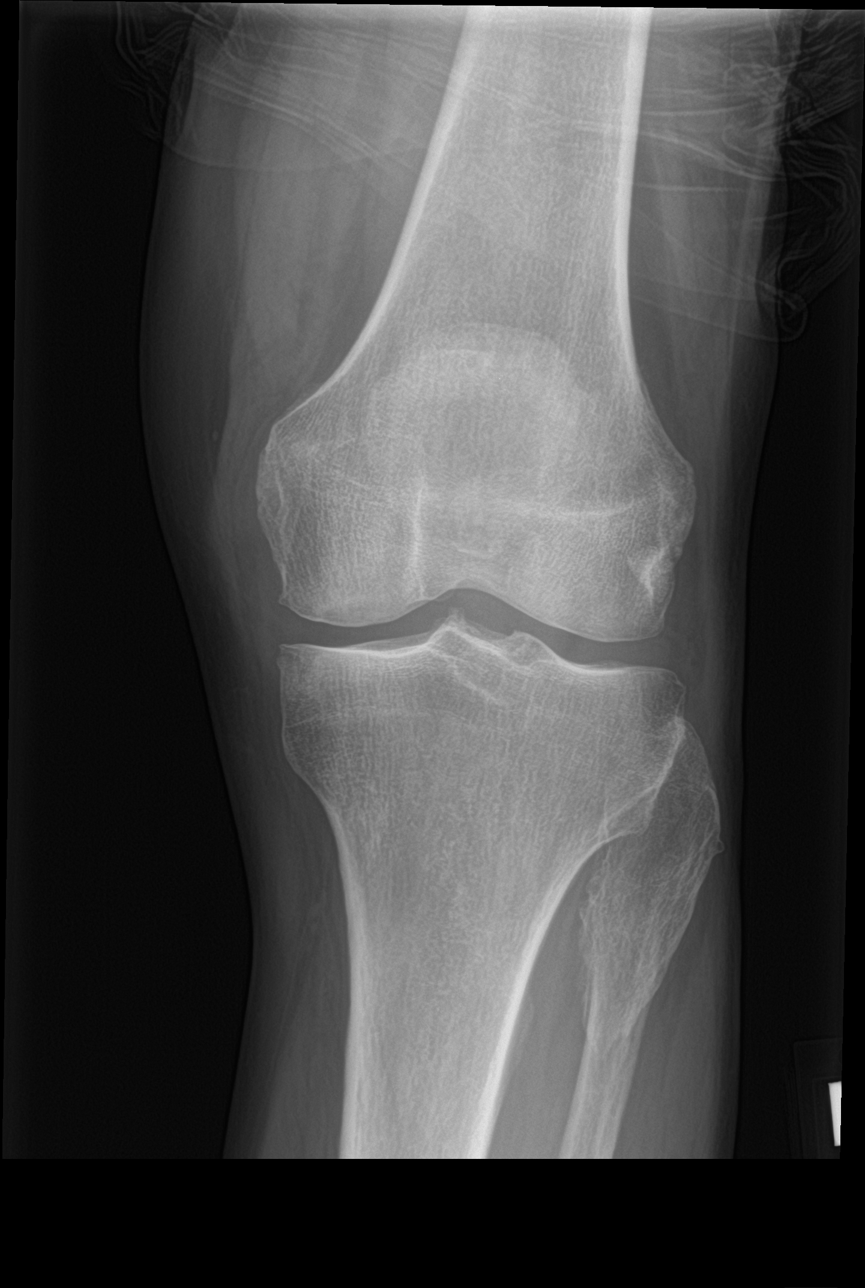

[knee obl (2 of 3)]
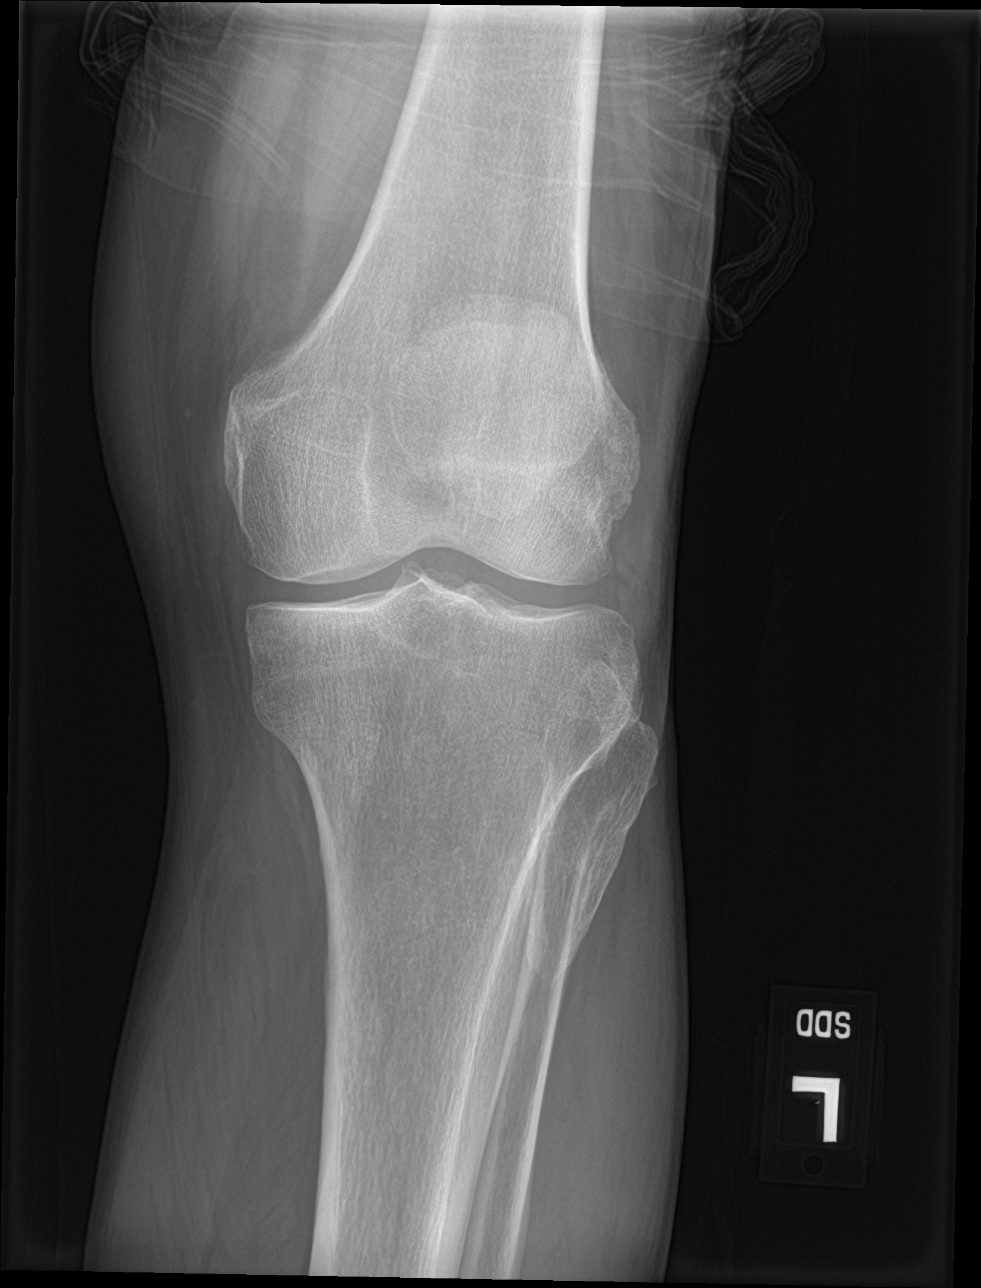

[knee lat]
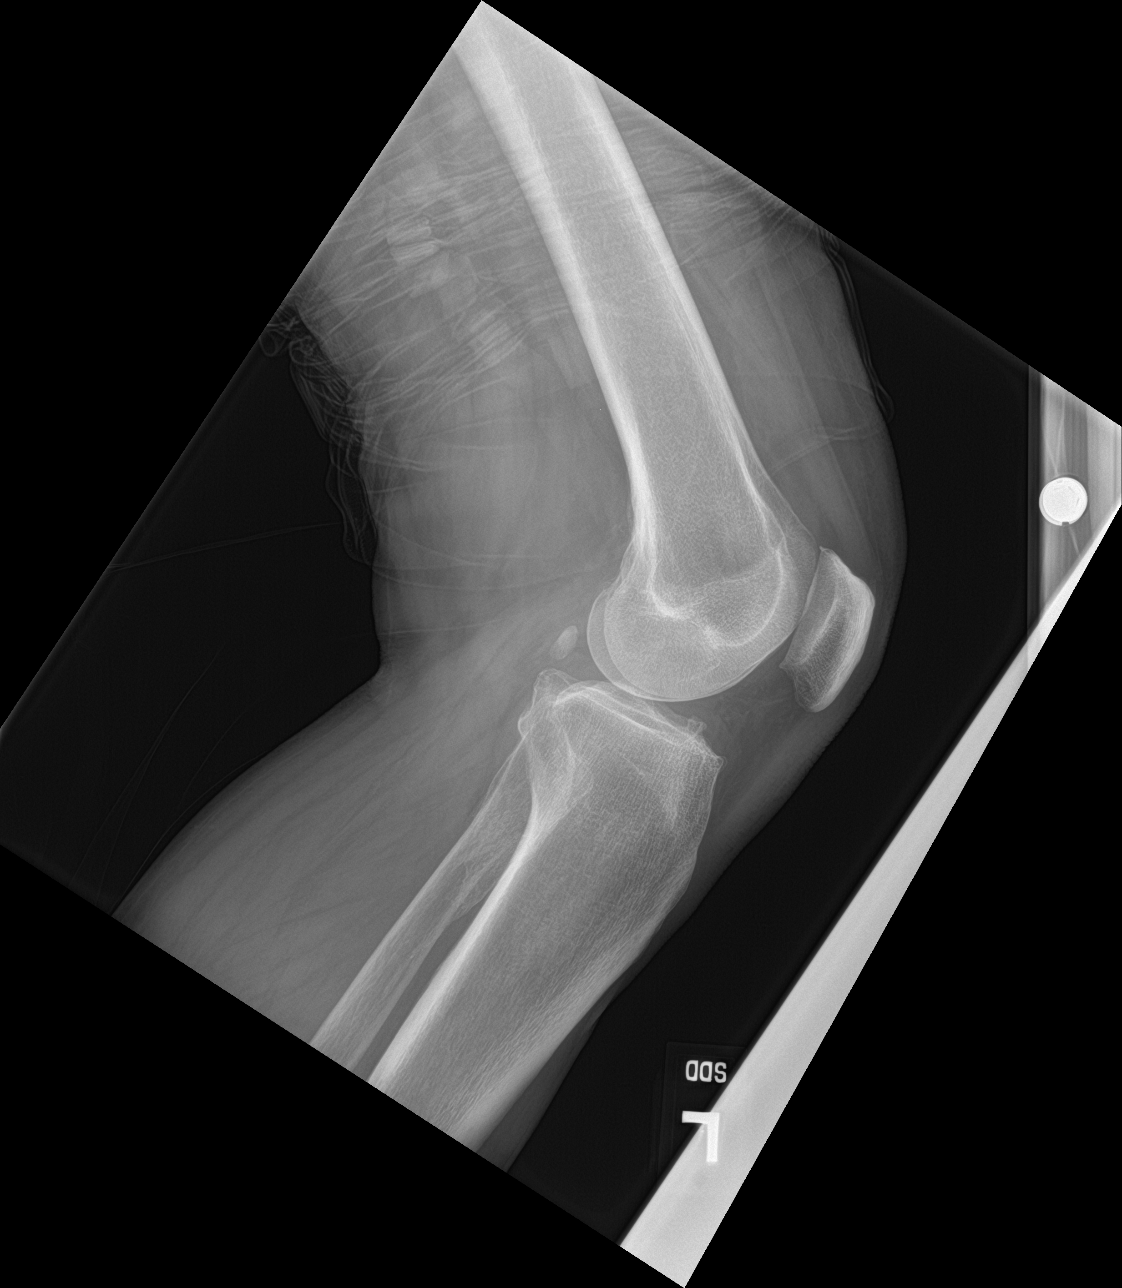

[knee obl (3 of 3)]
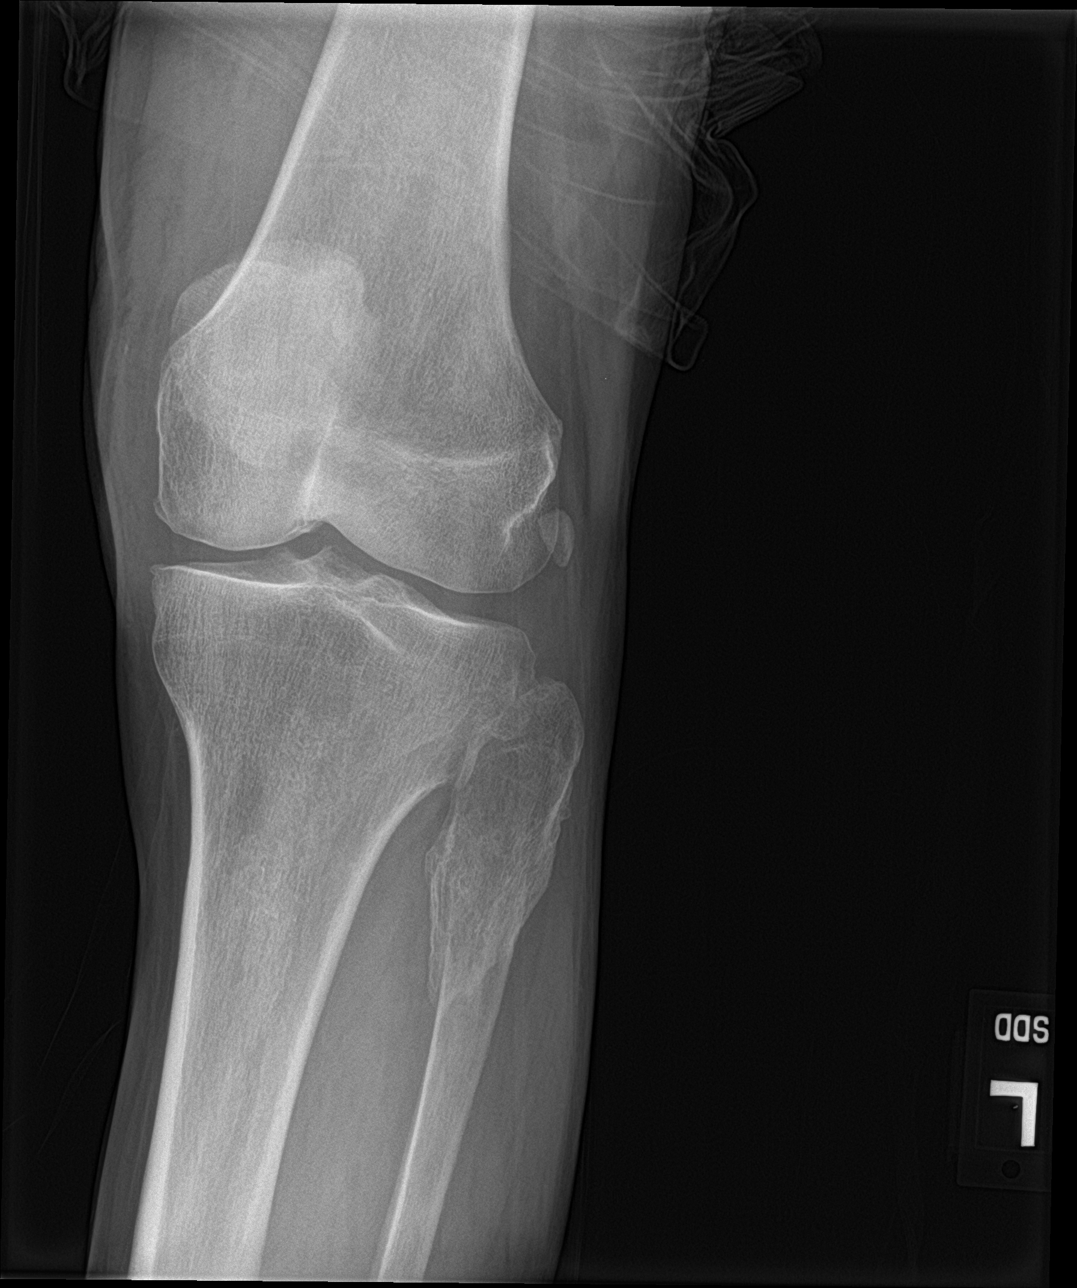

[4 of 4 positions shown; findings below may reference images not displayed]

FINDINGS: No acute bony abnormality. Specifically, no fracture, subluxation,
or dislocation. Early spurring. No joint effusion.
IMPRESSION: No acute bony abnormality.
